# Patient Record
Sex: Female | Born: 1965 | Race: White | Hispanic: No | Smoking: Never smoker
Health system: Southern US, Community
[De-identification: ages and names within clinical notes are randomized; demographics above are authoritative.]

## PROBLEM LIST (undated history)

## (undated) DIAGNOSIS — F419 Anxiety disorder, unspecified: Secondary | ICD-10-CM

## (undated) DIAGNOSIS — G5601 Carpal tunnel syndrome, right upper limb: Secondary | ICD-10-CM

## (undated) DIAGNOSIS — I10 Essential (primary) hypertension: Secondary | ICD-10-CM

## (undated) DIAGNOSIS — E7800 Pure hypercholesterolemia, unspecified: Secondary | ICD-10-CM

## (undated) HISTORY — PX: OTHER SURGICAL HISTORY: SHX169

## (undated) HISTORY — PX: TONSILLECTOMY: SUR1361

## (undated) HISTORY — DX: Pure hypercholesterolemia, unspecified: E78.00

## (undated) HISTORY — DX: Essential (primary) hypertension: I10

## (undated) HISTORY — DX: Anxiety disorder, unspecified: F41.9

---

## 2001-08-26 ENCOUNTER — Emergency Department (HOSPITAL_COMMUNITY): Admission: EM | Admit: 2001-08-26 | Discharge: 2001-08-26 | Payer: Self-pay

## 2007-05-16 ENCOUNTER — Ambulatory Visit: Payer: Self-pay | Admitting: Obstetrics and Gynecology

## 2008-06-18 ENCOUNTER — Ambulatory Visit: Payer: Self-pay | Admitting: Obstetrics and Gynecology

## 2009-06-23 ENCOUNTER — Ambulatory Visit: Payer: Self-pay

## 2009-07-13 ENCOUNTER — Ambulatory Visit: Payer: Self-pay

## 2010-06-28 ENCOUNTER — Ambulatory Visit: Payer: Self-pay

## 2012-07-09 ENCOUNTER — Ambulatory Visit: Payer: Self-pay

## 2013-07-15 ENCOUNTER — Ambulatory Visit: Payer: Self-pay | Admitting: Obstetrics and Gynecology

## 2013-07-24 ENCOUNTER — Ambulatory Visit: Payer: Self-pay | Admitting: Obstetrics and Gynecology

## 2014-06-18 LAB — HM PAP SMEAR: HM PAP: NEGATIVE

## 2014-07-16 ENCOUNTER — Ambulatory Visit: Payer: Self-pay | Admitting: Obstetrics and Gynecology

## 2015-06-21 ENCOUNTER — Encounter: Payer: Self-pay | Admitting: *Deleted

## 2015-06-22 ENCOUNTER — Ambulatory Visit (INDEPENDENT_AMBULATORY_CARE_PROVIDER_SITE_OTHER): Payer: PRIVATE HEALTH INSURANCE | Admitting: Obstetrics and Gynecology

## 2015-06-22 ENCOUNTER — Encounter: Payer: Self-pay | Admitting: Obstetrics and Gynecology

## 2015-06-22 VITALS — BP 146/95 | HR 88 | Ht 62.0 in | Wt 135.1 lb

## 2015-06-22 DIAGNOSIS — I10 Essential (primary) hypertension: Secondary | ICD-10-CM

## 2015-06-22 DIAGNOSIS — Z01419 Encounter for gynecological examination (general) (routine) without abnormal findings: Secondary | ICD-10-CM | POA: Diagnosis not present

## 2015-06-22 DIAGNOSIS — Z3041 Encounter for surveillance of contraceptive pills: Secondary | ICD-10-CM

## 2015-06-22 DIAGNOSIS — F419 Anxiety disorder, unspecified: Secondary | ICD-10-CM

## 2015-06-22 MED ORDER — NORETHINDRONE ACET-ETHINYL EST 1-20 MG-MCG PO TABS: 1.0000 | ORAL_TABLET | Freq: Every day | ORAL | Status: DC

## 2015-06-22 MED ORDER — ALPRAZOLAM 0.5 MG PO TABS: 0.5000 mg | ORAL_TABLET | Freq: Every evening | ORAL | Status: DC | PRN

## 2015-06-22 MED ORDER — BUPROPION HCL ER (XL) 300 MG PO TB24: 300.0000 mg | ORAL_TABLET | Freq: Every day | ORAL | Status: DC

## 2015-06-22 MED ORDER — TRIAMTERENE-HCTZ 50-25 MG PO CAPS: 1.0000 | ORAL_CAPSULE | ORAL | Status: DC

## 2015-06-22 NOTE — Progress Notes (Signed)
  Subjective:     Meghan Sandoval is a 49 y.o. female and is here for a comprehensive physical exam. The patient reports no problems.  History   Social History  . Marital Status: Divorced    Spouse Name: N/A  . Number of Children: N/A  . Years of Education: N/A   Occupational History  . Not on file.   Social History Main Topics  . Smoking status: Never Smoker   . Smokeless tobacco: Never Used  . Alcohol Use: Yes  . Drug Use: No  . Sexual Activity: Yes   Other Topics Concern  . Not on file   Social History Narrative   Health Maintenance  Topic Date Due  . HIV Screening  09/04/1981  . TETANUS/TDAP  09/04/1985  . INFLUENZA VACCINE  06/21/2015  . PAP SMEAR  06/18/2017    The following portions of the patient's history were reviewed and updated as appropriate: allergies, current medications, past family history, past medical history, past social history, past surgical history and problem list.  Review of Systems A comprehensive review of systems was negative.   Objective:    General appearance: alert, cooperative and appears stated age Neck: no adenopathy, no carotid bruit, no JVD, supple, symmetrical, trachea midline and thyroid not enlarged, symmetric, no tenderness/mass/nodules Lungs: clear to auscultation bilaterally Breasts: normal appearance, no masses or tenderness Heart: regular rate and rhythm, S1, S2 normal, no murmur, click, rub or gallop Abdomen: soft, non-tender; bowel sounds normal; no masses,  no organomegaly Pelvic: cervix normal in appearance, no adnexal masses or tenderness, no cervical motion tenderness, rectovaginal septum normal, uterus normal size, shape, and consistency, vagina normal without discharge and vulva red without lesions, c/w contact irritation    Assessment:    Healthy female exam. HTN; vulvar irritation; OCP user with no menses; Anxiety under good control with current medications      Plan:  Pap not indicated; meds refilled with  changing from methhyldopa to Dyazide 50/25 qd.   Routine MMG ordered. To repeat labs at work in December. RTC 1 year or prn   See After Visit Summary for Counseling Recommendations

## 2015-06-22 NOTE — Patient Instructions (Signed)
  Place annual gynecologic exam patient instructions here.  Thank you for enrolling in MyChart. Please follow the instructions below to securely access your online medical record. MyChart allows you to send messages to your doctor, view your test results, manage appointments, and more.   How Do I Sign Up? 1. In your Internet browser, go to [employer] and enter [email or web address]. 2. Click on the Sign Up Now link in the Sign In box. You will see the New Member Sign Up page. 3. Enter your MyChart Access Code exactly as it appears below. You will not need to use this code after you've completed the sign-up process. If you do not sign up before the expiration date, you must request a new code.  MyChart Access Code: [email or web address] Expires: 08/21/2015  2:55 PM  4. Enter your Social Security Number ([id number]) and Date of Birth (mm/dd/yyyy) as indicated and click Submit. You will be taken to the next sign-up page. 5. Create a MyChart ID. This will be your MyChart login ID and cannot be changed, so think of one that is secure and easy to remember. 6. Create a MyChart password. You can change your password at any time. 7. Enter your Password Reset Question and Answer. This can be used at a later time if you forget your password.  8. Enter your e-mail address. You will receive e-mail notification when new information is available in MyChart. 9. Click Sign Up. You can now view your medical record.   Additional Information Remember, MyChart is NOT to be used for urgent needs. For medical emergencies, dial 911.

## 2015-06-24 ENCOUNTER — Encounter: Payer: Self-pay | Admitting: Family Medicine

## 2015-08-25 ENCOUNTER — Other Ambulatory Visit: Payer: Self-pay | Admitting: Obstetrics and Gynecology

## 2015-08-25 DIAGNOSIS — N6320 Unspecified lump in the left breast, unspecified quadrant: Secondary | ICD-10-CM

## 2015-09-14 ENCOUNTER — Ambulatory Visit: Payer: Self-pay

## 2015-09-14 ENCOUNTER — Other Ambulatory Visit: Payer: Self-pay

## 2015-09-22 ENCOUNTER — Ambulatory Visit
Admission: RE | Admit: 2015-09-22 | Discharge: 2015-09-22 | Disposition: A | Discharge status: Home / Self Care | Payer: PRIVATE HEALTH INSURANCE | Source: Ambulatory Visit | Attending: Obstetrics and Gynecology | Admitting: Obstetrics and Gynecology

## 2015-09-22 ENCOUNTER — Other Ambulatory Visit: Payer: Self-pay | Admitting: Obstetrics and Gynecology

## 2015-09-22 DIAGNOSIS — N6320 Unspecified lump in the left breast, unspecified quadrant: Secondary | ICD-10-CM

## 2015-09-22 DIAGNOSIS — R928 Other abnormal and inconclusive findings on diagnostic imaging of breast: Secondary | ICD-10-CM | POA: Insufficient documentation

## 2015-09-22 DIAGNOSIS — N63 Unspecified lump in breast: Secondary | ICD-10-CM | POA: Diagnosis present

## 2015-09-22 LAB — HM MAMMOGRAPHY

## 2015-09-28 ENCOUNTER — Telehealth: Payer: Self-pay | Admitting: *Deleted

## 2015-09-28 NOTE — Telephone Encounter (Signed)
-----   Message from Ulyses AmorMelody N Burr, [place] sent at 09/28/2015  3:29 PM EST ----- Please let her know I have reviewed f/u breast MMG and it is no change

## 2015-09-28 NOTE — Telephone Encounter (Signed)
Notified pt of results 

## 2016-01-03 ENCOUNTER — Encounter: Payer: Self-pay | Admitting: Obstetrics and Gynecology

## 2016-01-04 ENCOUNTER — Other Ambulatory Visit: Payer: Self-pay | Admitting: Obstetrics and Gynecology

## 2016-01-04 DIAGNOSIS — Z0000 Encounter for general adult medical examination without abnormal findings: Secondary | ICD-10-CM

## 2016-01-21 ENCOUNTER — Other Ambulatory Visit: Payer: Self-pay

## 2016-01-21 DIAGNOSIS — Z0000 Encounter for general adult medical examination without abnormal findings: Secondary | ICD-10-CM

## 2016-01-21 NOTE — Progress Notes (Signed)
Patient came in to have blood drawn per Harlow MaresMelody Shambley, CNM order.  Blood was drawn from the left arm without any incident.

## 2016-01-23 LAB — TESTOSTERONE,FREE AND TOTAL
TESTOSTERONE FREE: 5.6 pg/mL — AB (ref 0.0–4.2)
Testosterone: 28 ng/dL (ref 8–48)

## 2016-01-23 LAB — CMP12+LP+TP+TSH+6AC+CBC/D/PLT
ALT: 21 IU/L (ref 0–32)
AST: 20 IU/L (ref 0–40)
Albumin/Globulin Ratio: 1.2 (ref 1.1–2.5)
Albumin: 4.4 g/dL (ref 3.5–5.5)
Alkaline Phosphatase: 67 IU/L (ref 39–117)
BASOS ABS: 0 10*3/uL (ref 0.0–0.2)
BASOS: 1 %
BUN / CREAT RATIO: 14 (ref 9–23)
BUN: 16 mg/dL (ref 6–24)
Bilirubin Total: 0.4 mg/dL (ref 0.0–1.2)
CALCIUM: 9.8 mg/dL (ref 8.7–10.2)
CHOL/HDL RATIO: 4.5 ratio — AB (ref 0.0–4.4)
CREATININE: 1.15 mg/dL — AB (ref 0.57–1.00)
Chloride: 97 mmol/L (ref 96–106)
Cholesterol, Total: 264 mg/dL — ABNORMAL HIGH (ref 100–199)
EOS (ABSOLUTE): 0.1 10*3/uL (ref 0.0–0.4)
EOS: 2 %
Estimated CHD Risk: 1.1 times avg. — ABNORMAL HIGH (ref 0.0–1.0)
Free Thyroxine Index: 2.7 (ref 1.2–4.9)
GFR calc non Af Amer: 56 mL/min/{1.73_m2} — ABNORMAL LOW (ref >59)
GFR, EST AFRICAN AMERICAN: 65 mL/min/{1.73_m2} (ref >59)
GGT: 43 IU/L (ref 0–60)
GLOBULIN, TOTAL: 3.6 g/dL (ref 1.5–4.5)
GLUCOSE: 87 mg/dL (ref 65–99)
HDL: 59 mg/dL (ref >39)
Hematocrit: 41 % (ref 34.0–46.6)
Hemoglobin: 14.3 g/dL (ref 11.1–15.9)
IMMATURE GRANS (ABS): 0 10*3/uL (ref 0.0–0.1)
IMMATURE GRANULOCYTES: 0 %
IRON: 132 ug/dL (ref 27–159)
LDH: 156 IU/L (ref 119–226)
LDL Calculated: 173 mg/dL — ABNORMAL HIGH (ref 0–99)
LYMPHS ABS: 0.8 10*3/uL (ref 0.7–3.1)
LYMPHS: 17 %
MCH: 31.9 pg (ref 26.6–33.0)
MCHC: 34.9 g/dL (ref 31.5–35.7)
MCV: 92 fL (ref 79–97)
MONOCYTES: 7 %
Monocytes Absolute: 0.3 10*3/uL (ref 0.1–0.9)
NEUTROS PCT: 73 %
Neutrophils Absolute: 3.4 10*3/uL (ref 1.4–7.0)
PHOSPHORUS: 2.9 mg/dL (ref 2.5–4.5)
PLATELETS: 253 10*3/uL (ref 150–379)
Potassium: 4.5 mmol/L (ref 3.5–5.2)
RBC: 4.48 x10E6/uL (ref 3.77–5.28)
RDW: 12.6 % (ref 12.3–15.4)
Sodium: 139 mmol/L (ref 134–144)
T3 UPTAKE RATIO: 21 % — AB (ref 24–39)
T4, Total: 12.7 ug/dL — ABNORMAL HIGH (ref 4.5–12.0)
TOTAL PROTEIN: 8 g/dL (ref 6.0–8.5)
TSH: 2.84 u[IU]/mL (ref 0.450–4.500)
Triglycerides: 159 mg/dL — ABNORMAL HIGH (ref 0–149)
URIC ACID: 6.5 mg/dL (ref 2.5–7.1)
VLDL Cholesterol Cal: 32 mg/dL (ref 5–40)
WBC: 4.6 10*3/uL (ref 3.4–10.8)

## 2016-01-23 LAB — ESTRADIOL: Estradiol: <5 pg/mL

## 2016-01-23 LAB — VITAMIN D 25 HYDROXY (VIT D DEFICIENCY, FRACTURES): VIT D 25 HYDROXY: 54.5 ng/mL (ref 30.0–100.0)

## 2016-01-23 LAB — FSH/LH
FSH: 8.7 m[IU]/mL
LH: 4.2 m[IU]/mL

## 2016-01-23 LAB — PROGESTERONE: PROGESTERONE: 0.2 ng/mL

## 2016-01-25 ENCOUNTER — Other Ambulatory Visit: Payer: Self-pay | Admitting: Obstetrics and Gynecology

## 2016-01-25 ENCOUNTER — Telehealth: Payer: Self-pay | Admitting: Obstetrics and Gynecology

## 2016-01-25 NOTE — Telephone Encounter (Signed)
PT CALLED AND SHE RECEIVED HER HORMONE RESULTS IN THE MAIL FROM YOU AND SHE IS HAVING A HARD TIME UNDERSTAND THE RESULTS AND WAS HOPING YOU COULD CALL HER AND GO OVER THEM WITH HER.

## 2016-01-31 ENCOUNTER — Ambulatory Visit: Payer: Self-pay | Admitting: Physician Assistant

## 2016-01-31 ENCOUNTER — Encounter: Payer: Self-pay | Admitting: Physician Assistant

## 2016-01-31 VITALS — BP 139/97 | HR 85 | Temp 98.6°F

## 2016-01-31 DIAGNOSIS — J069 Acute upper respiratory infection, unspecified: Secondary | ICD-10-CM

## 2016-01-31 MED ORDER — AZITHROMYCIN 250 MG PO TABS: ORAL_TABLET | ORAL | Status: AC

## 2016-01-31 NOTE — Progress Notes (Signed)
S: C/o runny nose and congestion for 7 days, no fever, chills, cp/sob, v/d; mucus was green this am but clear throughout the day, cough is sporadic, congested;   Using otc meds: allegra d  O: PE: vitals wnl, nad, perrl eomi, normocephalic, tms dull, nasal mucosa red and swollen, throat injected, neck supple no lymph, lungs c t a, cv rrr, neuro intact  A:  Acute uri   P: zpack, otc delsym for cough, drink fluids, continue regular meds , use otc meds of choice, return if not improving in 5 days, return earlier if worsening

## 2016-02-24 ENCOUNTER — Encounter: Payer: Self-pay | Admitting: Obstetrics and Gynecology

## 2016-03-15 ENCOUNTER — Ambulatory Visit (INDEPENDENT_AMBULATORY_CARE_PROVIDER_SITE_OTHER): Payer: Managed Care, Other (non HMO) | Admitting: Nurse Practitioner

## 2016-03-15 ENCOUNTER — Encounter: Payer: Self-pay | Admitting: Nurse Practitioner

## 2016-03-15 VITALS — BP 136/84 | HR 96 | Temp 98.9°F | Ht 62.0 in | Wt 134.4 lb

## 2016-03-15 DIAGNOSIS — F411 Generalized anxiety disorder: Secondary | ICD-10-CM | POA: Diagnosis not present

## 2016-03-15 DIAGNOSIS — E785 Hyperlipidemia, unspecified: Secondary | ICD-10-CM

## 2016-03-15 DIAGNOSIS — R51 Headache: Secondary | ICD-10-CM

## 2016-03-15 DIAGNOSIS — Z7189 Other specified counseling: Secondary | ICD-10-CM | POA: Diagnosis not present

## 2016-03-15 DIAGNOSIS — Z7689 Persons encountering health services in other specified circumstances: Secondary | ICD-10-CM

## 2016-03-15 DIAGNOSIS — R519 Headache, unspecified: Secondary | ICD-10-CM

## 2016-03-15 DIAGNOSIS — I10 Essential (primary) hypertension: Secondary | ICD-10-CM | POA: Diagnosis not present

## 2016-03-15 MED ORDER — BUSPIRONE HCL 7.5 MG PO TABS: 7.5000 mg | ORAL_TABLET | Freq: Three times a day (TID) | ORAL | Status: DC

## 2016-03-15 MED ORDER — LOSARTAN POTASSIUM-HCTZ 50-12.5 MG PO TABS: 1.0000 | ORAL_TABLET | Freq: Every day | ORAL | Status: DC

## 2016-03-15 NOTE — Patient Instructions (Signed)
[facility] Nice to meet you.   Please stop the Dyazide and start the new medication tomorrow morning. Have the nurse at your work check this in 1 week or sooner if you feel strange (shouldn't but just as a precaution).   Try the Buspirone 1 at night for 7 nights then can add as needed throughout the day- this will take the place of xanax- only use if severe anxiety.   Follow up in 4 weeks with Dr. Dan HumphreysWalker.

## 2016-03-15 NOTE — Progress Notes (Signed)
Patient ID: Meghan Sandoval, female    DOB: 07-02-1966  Age: 50 y.o. MRN: [id number]  CC: New Patient (Initial Visit)   HPI Meghan Sandoval presents for establishing care and CC of anxiety and HTN.   1) New Pt Info:   Immunizations- unknown- need records   Mammogram- 08/2015  Pap- has GYN (Melody LeonoreShambley)  2) Chronic Problems-  Frequent HA, HTN, HLD, Anxiety   3) Acute Problems-  Wellbutrin 300 mg daily x 5 years- doesn't feel like it is working as well currently   Xanax 0.5 mg tablet takes 1/2 tablet when stressed   Lisinopril- makes her cough   Willing to try another HTN medication    History Meghan Sandoval has a past medical history of Anxiety; Hypertension; and Hypercholesteremia.   She has past surgical history that includes no past surgery.   Her family history includes Other in her mother. There is no history of Breast cancer.She reports that she has never smoked. She has never used smokeless tobacco. She reports that she drinks alcohol. She reports that she does not use illicit drugs.  Outpatient Prescriptions Prior to Visit  Medication Sig Dispense Refill  . ALPRAZolam (XANAX) 0.5 MG tablet Take 1 tablet (0.5 mg total) by mouth at bedtime as needed for anxiety. 60 tablet 1  . buPROPion (WELLBUTRIN XL) 300 MG 24 hr tablet Take 1 tablet (300 mg total) by mouth daily. 30 tablet 12  . norethindrone-ethinyl estradiol (MICROGESTIN,JUNEL,LOESTRIN) 1-20 MG-MCG tablet Take 1 tablet by mouth daily. 1 Package 12  . triamterene-hydrochlorothiazide (DYAZIDE) 50-25 MG per capsule Take 1 capsule by mouth every morning. 30 capsule 6   No facility-administered medications prior to visit.    ROS Review of Systems  Constitutional: Negative for fever, chills, diaphoresis and fatigue.  Respiratory: Positive for cough. Negative for chest tightness, shortness of breath and wheezing.   Cardiovascular: Negative for chest pain, palpitations and leg swelling.  Gastrointestinal: Negative for nausea,  vomiting and diarrhea.  Skin: Negative for rash.  Neurological: Negative for dizziness, weakness, numbness and headaches.  Psychiatric/Behavioral: Negative for suicidal ideas and sleep disturbance. The patient is nervous/anxious.     Objective:  BP 136/84 mmHg  Pulse 96  Temp(Src) 98.9 F (37.2 C) (Oral)  Ht 5\' 2"  (1.575 m)  Wt 134 lb 6.4 oz (60.963 kg)  BMI 24.58 kg/m2  SpO2 98%  Physical Exam  Constitutional: She is oriented to person, place, and time. She appears well-developed and well-nourished. No distress.  HENT:  Head: Normocephalic and atraumatic.  Right Ear: External ear normal.  Left Ear: External ear normal.  Cardiovascular: Normal rate, regular rhythm and normal heart sounds.   Pulmonary/Chest: Effort normal and breath sounds normal. No respiratory distress. She has no wheezes. She has no rales. She exhibits no tenderness.  Neurological: She is alert and oriented to person, place, and time. No cranial nerve deficit. She exhibits normal muscle tone. Coordination normal.  Skin: Skin is warm and dry. No rash noted. She is not diaphoretic.  Psychiatric: She has a normal mood and affect. Her behavior is normal. Judgment and thought content normal.   Assessment & Plan:   Meghan Sandoval was seen today for new patient (initial visit).  Diagnoses and all orders for this visit:  Encounter to establish care  GAD (generalized anxiety disorder)  Benign essential HTN  Other orders -     Discontinue: busPIRone (BUSPAR) 7.5 MG tablet; Take 1 tablet (7.5 mg total) by mouth 3 (three) times daily. -  Discontinue: losartan-hydrochlorothiazide (HYZAAR) 50-12.5 MG tablet; Take 1 tablet by mouth daily. -     Discontinue: busPIRone (BUSPAR) 7.5 MG tablet; Take 1 tablet (7.5 mg total) by mouth 3 (three) times daily. -     busPIRone (BUSPAR) 7.5 MG tablet; Take 1 tablet (7.5 mg total) by mouth 3 (three) times daily. -     losartan-hydrochlorothiazide (HYZAAR) 50-12.5 MG tablet; Take 1  tablet by mouth daily.   I have discontinued Ms. Mcnelly's triamterene-hydrochlorothiazide. I am also having her maintain her ALPRAZolam, buPROPion, norethindrone-ethinyl estradiol, busPIRone, and losartan-hydrochlorothiazide.  Meds ordered this encounter  Medications  . DISCONTD: busPIRone (BUSPAR) 7.5 MG tablet    Sig: Take 1 tablet (7.5 mg total) by mouth 3 (three) times daily.    Dispense:  90 tablet    Refill:  0    Order Specific Question:  Supervising Provider    Answer:  Duncan Dull L [[id number]]  . DISCONTD: losartan-hydrochlorothiazide (HYZAAR) 50-12.5 MG tablet    Sig: Take 1 tablet by mouth daily.    Dispense:  30 tablet    Refill:  1    Order Specific Question:  Supervising Provider    Answer:  Duncan Dull L [[id number]]  . DISCONTD: busPIRone (BUSPAR) 7.5 MG tablet    Sig: Take 1 tablet (7.5 mg total) by mouth 3 (three) times daily.    Dispense:  90 tablet    Refill:  0    Order Specific Question:  Supervising Provider    Answer:  Duncan Dull L [[id number]]  . busPIRone (BUSPAR) 7.5 MG tablet    Sig: Take 1 tablet (7.5 mg total) by mouth 3 (three) times daily.    Dispense:  90 tablet    Refill:  0    Order Specific Question:  Supervising Provider    Answer:  Duncan Dull L [[id number]]  . losartan-hydrochlorothiazide (HYZAAR) 50-12.5 MG tablet    Sig: Take 1 tablet by mouth daily.    Dispense:  30 tablet    Refill:  1    Order Specific Question:  Supervising Provider    Answer:  Sherlene Shams [[id number]]     Follow-up: Return in about 4 weeks (around 04/12/2016) for Follow up for HTN medication change .

## 2016-03-19 ENCOUNTER — Encounter: Payer: Self-pay | Admitting: Nurse Practitioner

## 2016-03-19 DIAGNOSIS — R51 Headache: Secondary | ICD-10-CM

## 2016-03-19 DIAGNOSIS — R519 Headache, unspecified: Secondary | ICD-10-CM | POA: Insufficient documentation

## 2016-03-19 DIAGNOSIS — E785 Hyperlipidemia, unspecified: Secondary | ICD-10-CM | POA: Insufficient documentation

## 2016-03-19 DIAGNOSIS — F411 Generalized anxiety disorder: Secondary | ICD-10-CM | POA: Insufficient documentation

## 2016-03-19 DIAGNOSIS — I10 Essential (primary) hypertension: Secondary | ICD-10-CM | POA: Insufficient documentation

## 2016-03-19 NOTE — Assessment & Plan Note (Signed)
Pt stable currently on Wellbutrin 300 mg x 5 years, but feels it isn't working currently. Discussed different treatment options, will supplement with Buspirone and save xanax for severe anxiety.  Follow up in 4 weeks for medications

## 2016-03-19 NOTE — Assessment & Plan Note (Signed)
Discussed acute and chronic issues. Reviewed health maintenance measures, PFSHx, and immunizations. Obtain records from previous facility.   

## 2016-03-19 NOTE — Assessment & Plan Note (Signed)
Stable currently- will obtain records with last labs

## 2016-03-19 NOTE — Assessment & Plan Note (Signed)
BP Readings from Last 3 Encounters:  03/15/16 136/84  01/31/16 139/97  06/22/15 146/95   Instructions to pt on AVS were reviewed  Stop Dyazide and start the Hyzaar tomorrow morning. Have the nurse at your work check this in 1 week or sooner if feeling strange  Fu in 4 weeks with new PCP

## 2016-03-19 NOTE — Assessment & Plan Note (Signed)
Stable currently Will follow as needed

## 2016-03-28 ENCOUNTER — Other Ambulatory Visit: Payer: Self-pay | Admitting: *Deleted

## 2016-03-28 ENCOUNTER — Encounter: Payer: Self-pay | Admitting: Obstetrics and Gynecology

## 2016-03-28 MED ORDER — NORETHINDRONE ACET-ETHINYL EST 1-20 MG-MCG PO TABS: 1.0000 | ORAL_TABLET | Freq: Every day | ORAL | Status: DC

## 2016-04-06 ENCOUNTER — Ambulatory Visit: Payer: Self-pay | Admitting: Physician Assistant

## 2016-04-06 ENCOUNTER — Encounter: Payer: Self-pay | Admitting: Physician Assistant

## 2016-04-06 VITALS — BP 130/80 | HR 89 | Temp 98.6°F

## 2016-04-06 DIAGNOSIS — G5601 Carpal tunnel syndrome, right upper limb: Secondary | ICD-10-CM

## 2016-04-06 MED ORDER — MELOXICAM 15 MG PO TABS: 15.0000 mg | ORAL_TABLET | Freq: Every day | ORAL | Status: DC

## 2016-04-06 NOTE — Progress Notes (Signed)
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 CTS R   P Wrist immobilizer . rx Mobic 15 mg one qd. topical heat or ice. F/u prn not improving. wrist stretches referred to [facility]

## 2016-04-19 ENCOUNTER — Encounter: Payer: Self-pay | Admitting: Internal Medicine

## 2016-04-19 ENCOUNTER — Ambulatory Visit (INDEPENDENT_AMBULATORY_CARE_PROVIDER_SITE_OTHER): Payer: Managed Care, Other (non HMO) | Admitting: Internal Medicine

## 2016-04-19 VITALS — BP 156/88 | HR 86 | Ht 62.0 in | Wt 137.4 lb

## 2016-04-19 DIAGNOSIS — I10 Essential (primary) hypertension: Secondary | ICD-10-CM | POA: Diagnosis not present

## 2016-04-19 DIAGNOSIS — F411 Generalized anxiety disorder: Secondary | ICD-10-CM

## 2016-04-19 LAB — BASIC METABOLIC PANEL
BUN: 16 mg/dL (ref 6–23)
CALCIUM: 9.5 mg/dL (ref 8.4–10.5)
CO2: 29 mEq/L (ref 19–32)
Chloride: 98 mEq/L (ref 96–112)
Creatinine, Ser: 0.89 mg/dL (ref 0.40–1.20)
GFR: 71.47 mL/min (ref >60.00)
GLUCOSE: 90 mg/dL (ref 70–99)
Potassium: 4.2 mEq/L (ref 3.5–5.1)
SODIUM: 134 meq/L — AB (ref 135–145)

## 2016-04-19 MED ORDER — SERTRALINE HCL 25 MG PO TABS: 25.0000 mg | ORAL_TABLET | Freq: Every day | ORAL | Status: DC

## 2016-04-19 MED ORDER — LOSARTAN POTASSIUM-HCTZ 50-12.5 MG PO TABS: 1.0000 | ORAL_TABLET | Freq: Every day | ORAL | Status: DC

## 2016-04-19 NOTE — Patient Instructions (Addendum)
Stop Buspar.  Start Sertraline 25mg  daily at bedtime. Continue Wellbutrin.  Email with update next week.  Follow up in 4 weeks.

## 2016-04-19 NOTE — Assessment & Plan Note (Signed)
BP Readings from Last 3 Encounters:  04/19/16 156/88  04/06/16 130/80  03/15/16 136/84   BP elevated, however she reports it is better controlled at home and work. Anxiety likely contributing.  Will continue Losartan-HCTZ. Cr and K with labs today. Follow up in 4 weeks.

## 2016-04-19 NOTE — Progress Notes (Signed)
Pre visit review using our clinic review tool, if applicable. No additional management support is needed unless otherwise documented below in the visit note. 

## 2016-04-19 NOTE — Progress Notes (Signed)
Subjective:    Patient ID: Meghan Sandoval, female    DOB: [birth year], 50 y.o.   MRN: [id number]  HPI  49YO female presents to establish care. Formerly seen by Naomie Dean, NP.  HTN - BP always high at doctor visit. Compliant with medication. Typically 140/80. Started on BP meds a few years. No history of pre-eclampsia.  Anxiety - No improvement with use of Buspar. Taking Wellbutrin for about 5 years. This seems to work best for her. Tried Prozac, Lexapro in past but could not tolerate. Takes Alprazolam rarely for anxiety. Notes significant stressors with her job. Works as a IT consultant.  GERD - Has occasional symptoms of reflux symptoms using Ranitidine with minimal improvement.  Symptoms have currently resolved.  Wt Readings from Last 3 Encounters:  04/19/16 137 lb 6.4 oz (62.324 kg)  03/15/16 134 lb 6.4 oz (60.963 kg)  06/22/15 135 lb 1.6 oz (61.281 kg)   BP Readings from Last 3 Encounters:  04/19/16 156/88  04/06/16 130/80  03/15/16 136/84    Past Medical History  Diagnosis Date  . Anxiety   . Hypertension   . Hypercholesteremia    Family History  Problem Relation Age of Onset  . Breast cancer Neg Hx   . Other Mother     gastrointestinal   Past Surgical History  Procedure Laterality Date  . No past surgery     Social History   Social History  . Marital Status: Divorced    Spouse Name: N/A  . Number of Children: 2  . Years of Education: Advice worker   Occupational History  . [employer]   Social History Main Topics  . Smoking status: Never Smoker   . Smokeless tobacco: Never Used  . Alcohol Use: 0.0 oz/week    0 Standard drinks or equivalent per week     Comment: occasionally  . Drug Use: No  . Sexual Activity:    Partners: Male    Birth Control/ Protection: Post-menopausal   Other Topics Concern  . None   Social History Narrative   Patient lives at home with fiance.    Caffeine Use: 2-3 cups daily   Employed as a Civil Service fast streamer    Some college    2 children (1 daughter works as a Clinical biochemist at [employer])        Review of Systems  Constitutional: Negative for fever, chills, appetite change, fatigue and unexpected weight change.  Eyes: Negative for visual disturbance.  Respiratory: Negative for shortness of breath.   Cardiovascular: Negative for chest pain and leg swelling.  Gastrointestinal: Negative for nausea, vomiting, abdominal pain, diarrhea, constipation and abdominal distention.  Skin: Negative for color change and rash.  Hematological: Negative for adenopathy. Does not bruise/bleed easily.  Psychiatric/Behavioral: Negative for sleep disturbance and dysphoric mood. The patient is nervous/anxious.        Objective:    BP 156/88 mmHg  Pulse 86  Ht  (1.575 m)  Wt 137 lb 6.4 oz (62.324 kg)  BMI 25.12 kg/m2  SpO2 98% Physical Exam  Constitutional: She is oriented to person, place, and time. She appears well-developed and well-nourished. No distress.  HENT:  Head: Normocephalic and atraumatic.  Right Ear: External ear normal.  Left Ear: External ear normal.  Nose: Nose normal.  Mouth/Throat: Oropharynx is clear and moist. No oropharyngeal exudate.  Eyes: Conjunctivae are normal. Pupils are equal, round, and reactive to light. Right eye exhibits no discharge. Left eye exhibits no discharge.  No scleral icterus.  Neck: Normal range of motion. Neck supple. No tracheal deviation present. No thyromegaly present.  Cardiovascular: Normal rate, regular rhythm, normal heart sounds and intact distal pulses.  Exam reveals no gallop and no friction rub.   No murmur heard. Pulmonary/Chest: Effort normal and breath sounds normal. No respiratory distress. She has no wheezes. She has no rales. She exhibits no tenderness.  Musculoskeletal: Normal range of motion. She exhibits no edema or tenderness.  Lymphadenopathy:    She has no cervical adenopathy.  Neurological: She is alert and oriented to person,  place, and time. No cranial nerve deficit. She exhibits normal muscle tone. Coordination normal.  Skin: Skin is warm and dry. No rash noted. She is not diaphoretic. No erythema. No pallor.  Psychiatric: Her speech is normal and behavior is normal. Judgment and thought content normal. Her mood appears anxious. Cognition and memory are normal.          Assessment & Plan:   Problem List Items Addressed This Visit      Unprioritized   Benign essential HTN    BP Readings from Last 3 Encounters:  04/19/16 156/88  04/06/16 130/80  03/15/16 136/84   BP elevated, however she reports it is better controlled at home and work. Anxiety likely contributing.  Will continue Losartan-HCTZ. Cr and K with labs today. Follow up in 4 weeks.      Relevant Medications   losartan-hydrochlorothiazide (HYZAAR) 50-12.5 MG tablet   Other Relevant Orders   Basic Metabolic Panel (BMET)   GAD (generalized anxiety disorder) - Primary    Symptoms poorly controlled. Will stop buspar. Add Sertraline 25mg  at bedtime. She will email with update next week, plan to titrate up dose of Sertraline to 50mg  if tolerated well. Continue Alprazolam prn for episodes of severe anxiety.          Return in about 4 weeks (around 05/17/2016) for Recheck.  Ronna PolioJennifer Armend Hochstatter, MD Internal Medicine [facility]

## 2016-04-19 NOTE — Assessment & Plan Note (Signed)
Symptoms poorly controlled. Will stop buspar. Add Sertraline 25mg  at bedtime. She will email with update next week, plan to titrate up dose of Sertraline to 50mg  if tolerated well. Continue Alprazolam prn for episodes of severe anxiety.

## 2016-05-01 ENCOUNTER — Other Ambulatory Visit: Payer: Self-pay | Admitting: Internal Medicine

## 2016-05-01 ENCOUNTER — Encounter: Payer: Self-pay | Admitting: Internal Medicine

## 2016-05-01 MED ORDER — SERTRALINE HCL 50 MG PO TABS: 50.0000 mg | ORAL_TABLET | Freq: Every day | ORAL | Status: DC

## 2016-05-02 ENCOUNTER — Other Ambulatory Visit: Payer: Self-pay | Admitting: Nurse Practitioner

## 2016-05-02 NOTE — Telephone Encounter (Signed)
Refilled in 06/2015 and last seen 04/19/16. Please advise?

## 2016-06-09 ENCOUNTER — Encounter: Payer: Self-pay | Admitting: Internal Medicine

## 2016-06-09 ENCOUNTER — Ambulatory Visit (INDEPENDENT_AMBULATORY_CARE_PROVIDER_SITE_OTHER): Payer: Managed Care, Other (non HMO) | Admitting: Internal Medicine

## 2016-06-09 VITALS — BP 158/90 | HR 85 | Ht 62.0 in | Wt 137.8 lb

## 2016-06-09 DIAGNOSIS — F411 Generalized anxiety disorder: Secondary | ICD-10-CM

## 2016-06-09 DIAGNOSIS — I10 Essential (primary) hypertension: Secondary | ICD-10-CM | POA: Diagnosis not present

## 2016-06-09 NOTE — Assessment & Plan Note (Signed)
BP Readings from Last 3 Encounters:  06/09/16 158/90  04/19/16 156/88  04/06/16 130/80   BP continues to be elevated. She reports BP better controlled at home. Discussed option of increasing Losartan-HCTZ to 100-12.5mg , but she prefers to hold for now, given she is traveling next week. She will follow up with new PCP in 4 weeks here.

## 2016-06-09 NOTE — Patient Instructions (Addendum)
Continue current medication     Follow up in 4 weeks

## 2016-06-09 NOTE — Progress Notes (Signed)
Subjective:    Patient ID: Meghan Sandoval, female    DOB: [birth year], 50 y.o.   MRN: [id number]  HPI  50YO female presents for follow up. Recently seen by Naomie Deanarrie Doss, NP, then by me in 03/2016.  Evaluated for HTN and GAD. Started on Sertraline.  Anxiety much better on Sertraline. She reports this medicine works better in terms of controlling anxiety, than any medication she has tried in the past.  HTN - Compliant with medication. No HA, CP. Does not check BP at home.   Wt Readings from Last 3 Encounters:  06/09/16 137 lb 12.8 oz (62.506 kg)  04/19/16 137 lb 6.4 oz (62.324 kg)  03/15/16 134 lb 6.4 oz (60.963 kg)   BP Readings from Last 3 Encounters:  06/09/16 158/90  04/19/16 156/88  04/06/16 130/80    Past Medical History  Diagnosis Date  . Anxiety   . Hypertension   . Hypercholesteremia    Family History  Problem Relation Age of Onset  . Breast cancer Neg Hx   . Other Mother     gastrointestinal   Past Surgical History  Procedure Laterality Date  . No past surgery     Social History   Social History  . Marital Status: Divorced    Spouse Name: N/A  . Number of Children: 2  . Years of Education: [employer]   Occupational History  . [employer] [place]   Social History Main Topics  . Smoking status: Never Smoker   . Smokeless tobacco: Never Used  . Alcohol Use: 0.0 oz/week    0 Standard drinks or equivalent per week     Comment: occasionally  . Drug Use: No  . Sexual Activity:    Partners: Male    Birth Control/ Protection: Post-menopausal   Other Topics Concern  . None   Social History Narrative   Patient lives at home with fiance.    Caffeine Use: 2-3 cups daily   Employed as a Psychologist, sport and exerciseparalegal/ legal assistant    Some college    2 children (1 daughter works as a Clinical biochemistCMA at [employer])        Review of Systems  Constitutional: Negative for fever, chills, appetite change, fatigue and unexpected weight change.  Eyes: Negative for visual  disturbance.  Respiratory: Negative for shortness of breath.   Cardiovascular: Negative for chest pain, palpitations and leg swelling.  Gastrointestinal: Negative for nausea, vomiting, abdominal pain, diarrhea and constipation.  Musculoskeletal: Negative for myalgias and arthralgias.  Skin: Negative for color change and rash.  Neurological: Negative for headaches.  Hematological: Negative for adenopathy. Does not bruise/bleed easily.  Psychiatric/Behavioral: Negative for behavioral problems, sleep disturbance, dysphoric mood and agitation. The patient is not nervous/anxious.        Objective:    BP 158/90 mmHg  Pulse 85  Ht 5\' 2"  (1.575 m)  Wt 137 lb 12.8 oz (62.506 kg)  BMI 25.20 kg/m2  SpO2 99% Physical Exam  Constitutional: She is oriented to person, place, and time. She appears well-developed and well-nourished. No distress.  HENT:  Head: Normocephalic and atraumatic.  Right Ear: External ear normal.  Left Ear: External ear normal.  Nose: Nose normal.  Mouth/Throat: Oropharynx is clear and moist. No oropharyngeal exudate.  Eyes: Conjunctivae are normal. Pupils are equal, round, and reactive to light. Right eye exhibits no discharge. Left eye exhibits no discharge. No scleral icterus.  Neck: Normal range of motion. Neck supple. No tracheal deviation present. No thyromegaly present.  Cardiovascular: Normal rate, regular rhythm, normal heart sounds and intact distal pulses.  Exam reveals no gallop and no friction rub.   No murmur heard. Pulmonary/Chest: Effort normal and breath sounds normal. No respiratory distress. She has no wheezes. She has no rales. She exhibits no tenderness.  Musculoskeletal: Normal range of motion. She exhibits no edema or tenderness.  Lymphadenopathy:    She has no cervical adenopathy.  Neurological: She is alert and oriented to person, place, and time. No cranial nerve deficit. She exhibits normal muscle tone. Coordination normal.  Skin: Skin is warm  and dry. No rash noted. She is not diaphoretic. No erythema. No pallor.  Psychiatric: She has a normal mood and affect. Her behavior is normal. Judgment and thought content normal.          Assessment & Plan:   Problem List Items Addressed This Visit      Unprioritized   Benign essential HTN    BP Readings from Last 3 Encounters:  06/09/16 158/90  04/19/16 156/88  04/06/16 130/80   BP continues to be elevated. She reports BP better controlled at home. Discussed option of increasing Losartan-HCTZ to 100-12.5mg , but she prefers to hold for now, given she is traveling next week. She will follow up with new PCP in 4 weeks here.      GAD (generalized anxiety disorder) - Primary    Symptoms improved with addition of Sertraline. Discussed option of increasing dose in future, but will hold for now, given significant improvement. Continue Wellbutrin and prn Alprazolam.          Return in about 4 weeks (around 07/07/2016) for New Patient.  Ronna Polio, MD Internal Medicine [facility]

## 2016-06-09 NOTE — Assessment & Plan Note (Signed)
Symptoms improved with addition of Sertraline. Discussed option of increasing dose in future, but will hold for now, given significant improvement. Continue Wellbutrin and prn Alprazolam.

## 2016-06-09 NOTE — Progress Notes (Signed)
Pre visit review using our clinic review tool, if applicable. No additional management support is needed unless otherwise documented below in the visit note. 

## 2016-06-21 ENCOUNTER — Encounter: Payer: PRIVATE HEALTH INSURANCE | Admitting: Obstetrics and Gynecology

## 2016-06-22 ENCOUNTER — Encounter: Payer: PRIVATE HEALTH INSURANCE | Admitting: Obstetrics and Gynecology

## 2016-06-28 ENCOUNTER — Ambulatory Visit (INDEPENDENT_AMBULATORY_CARE_PROVIDER_SITE_OTHER): Payer: Managed Care, Other (non HMO) | Admitting: Obstetrics and Gynecology

## 2016-06-28 ENCOUNTER — Encounter: Payer: Self-pay | Admitting: Obstetrics and Gynecology

## 2016-06-28 VITALS — BP 160/102 | HR 98 | Ht 62.0 in | Wt 137.9 lb

## 2016-06-28 DIAGNOSIS — Z01419 Encounter for gynecological examination (general) (routine) without abnormal findings: Secondary | ICD-10-CM | POA: Diagnosis not present

## 2016-06-28 MED ORDER — NORETHINDRONE ACET-ETHINYL EST 1-20 MG-MCG PO TABS: 1.0000 | ORAL_TABLET | Freq: Every day | ORAL | 4 refills | Status: DC

## 2016-06-28 MED ORDER — ALPRAZOLAM 0.5 MG PO TABS: 0.5000 mg | ORAL_TABLET | Freq: Every evening | ORAL | 1 refills | Status: DC | PRN

## 2016-06-28 MED ORDER — BUPROPION HCL ER (XL) 300 MG PO TB24: 300.0000 mg | ORAL_TABLET | Freq: Every day | ORAL | 4 refills | Status: DC

## 2016-06-28 MED ORDER — SERTRALINE HCL 50 MG PO TABS: 50.0000 mg | ORAL_TABLET | Freq: Every day | ORAL | 4 refills | Status: DC

## 2016-06-28 MED ORDER — LOSARTAN POTASSIUM-HCTZ 50-12.5 MG PO TABS: 1.0000 | ORAL_TABLET | Freq: Every day | ORAL | 4 refills | Status: DC

## 2016-06-28 NOTE — Progress Notes (Signed)
Subjective:   Meghan Sandoval is a 50 y.o. G2P0 Caucasian female here for a routine well-woman exam.  No LMP recorded. Patient is postmenopausal.    Current complaints: NONE PCP: [facility]       doesn't desire labs  Social History: Sexual: heterosexual Marital Status: married Living situation: with family Occupation: unknown occupation Tobacco/alcohol: no tobacco use Illicit drugs: no history of illicit drug use  The following portions of the patient's history were reviewed and updated as appropriate: allergies, current medications, past family history, past medical history, past social history, past surgical history and problem list.  Past Medical History Past Medical History:  Diagnosis Date  . Anxiety   . Hypercholesteremia   . Hypertension     Past Surgical History Past Surgical History:  Procedure Laterality Date  . no past surgery      Gynecologic History G2P0  No LMP recorded. Patient is postmenopausal. Contraception: OCP (estrogen/progesterone) Last Pap: 2015. Results were: normal Last mammogram: 2016. Results were: normal  Obstetric History OB History  Gravida Para Term Preterm AB Living  2         2  SAB TAB Ectopic Multiple Live Births          2    # Outcome Date GA Lbr Len/2nd Weight Sex Delivery Anes PTL Lv  2 Gravida 1996    F Vag-Spont   LIV  1 Gravida 1992    F Vag-Spont   LIV      Current Medications Current Outpatient Prescriptions on File Prior to Visit  Medication Sig Dispense Refill  . ALPRAZolam (XANAX) 0.5 MG tablet Take 1 tablet (0.5 mg total) by mouth at bedtime as needed for anxiety. 60 tablet 1  . buPROPion (WELLBUTRIN XL) 300 MG 24 hr tablet Take 1 tablet (300 mg total) by mouth daily. 30 tablet 12  . losartan-hydrochlorothiazide (HYZAAR) 50-12.5 MG tablet Take 1 tablet by mouth daily. 90 tablet 1  . norethindrone-ethinyl estradiol (MICROGESTIN,JUNEL,LOESTRIN) 1-20 MG-MCG tablet Take 1 tablet by mouth daily. 1 Package 4  .  sertraline (ZOLOFT) 50 MG tablet Take 1 tablet (50 mg total) by mouth daily. 30 tablet 6   No current facility-administered medications on file prior to visit.     Review of Systems Patient denies any headaches, blurred vision, shortness of breath, chest pain, abdominal pain, problems with bowel movements, urination, or intercourse.  Objective:  BP (!) 160/102   Pulse 98   Ht 5\' 2"  (1.575 m)   Wt 137 lb 14.4 oz (62.6 kg)   BMI 25.22 kg/m  Physical Exam  General:  Well developed, well nourished, no acute distress. She is alert and oriented x3. Skin:  Warm and dry Neck:  Midline trachea, no thyromegaly or nodules Cardiovascular: Regular rate and rhythm, no murmur heard Lungs:  Effort normal, all lung fields clear to auscultation bilaterally Breasts:  No dominant palpable mass, retraction, or nipple discharge Abdomen:  Soft, non tender, no hepatosplenomegaly or masses Pelvic:  External genitalia is normal in appearance.  The vagina is normal in appearance. The cervix is bulbous, no CMT.  Thin prep pap is not done. Uterus is felt to be normal size, shape, and contour.  No adnexal masses or tenderness noted. Extremities:  No swelling or varicosities noted Psych:  She has a normal mood and affect  Assessment:   Healthy well-woman exam Essential HTN Anxiety   Plan:  meds refilled F/U 1 year for AE, or sooner if needed Mammogram ordered  Pailynn Vahey  Rockney Ghee, CNM

## 2016-06-28 NOTE — Patient Instructions (Signed)
Preventive Care for Adults, Female A healthy lifestyle and preventive care can promote health and wellness. Preventive health guidelines for women include the following key practices.  A routine yearly physical is a good way to check with your health care provider about your health and preventive screening. It is a chance to share any concerns and updates on your health and to receive a thorough exam.  Visit your dentist for a routine exam and preventive care every 6 months. Brush your teeth twice a day and floss once a day. Good oral hygiene prevents tooth decay and gum disease.  The frequency of eye exams is based on your age, health, family medical history, use of contact lenses, and other factors. Follow your health care provider's recommendations for frequency of eye exams.  Eat a healthy diet. Foods like vegetables, fruits, whole grains, low-fat dairy products, and lean protein foods contain the nutrients you need without too many calories. Decrease your intake of foods high in solid fats, added sugars, and salt. Eat the right amount of calories for you.Get information about a proper diet from your health care provider, if necessary.  Regular physical exercise is one of the most important things you can do for your health. Most adults should get at least 150 minutes of moderate-intensity exercise (any activity that increases your heart rate and causes you to sweat) each week. In addition, most adults need muscle-strengthening exercises on 2 or more days a week.  Maintain a healthy weight. The body mass index (BMI) is a screening tool to identify possible weight problems. It provides an estimate of body fat based on height and weight. Your health care provider can find your BMI and can help you achieve or maintain a healthy weight.For adults 20 years and older:  A BMI below 18.5 is considered underweight.  A BMI of 18.5 to 24.9 is normal.  A BMI of 25 to 29.9 is considered  overweight.  A BMI of 30 and above is considered obese.  Maintain normal blood lipids and cholesterol levels by exercising and minimizing your intake of saturated fat. Eat a balanced diet with plenty of fruit and vegetables. Blood tests for lipids and cholesterol should begin at age 64 and be repeated every 5 years. If your lipid or cholesterol levels are high, you are over 50, or you are at high risk for heart disease, you may need your cholesterol levels checked more frequently.Ongoing high lipid and cholesterol levels should be treated with medicines if diet and exercise are not working.  If you smoke, find out from your health care provider how to quit. If you do not use tobacco, do not start.  Lung cancer screening is recommended for adults aged 52-80 years who are at high risk for developing lung cancer because of a history of smoking. A yearly low-dose CT scan of the lungs is recommended for people who have at least a 30-pack-year history of smoking and are a current smoker or have quit within the past 15 years. A pack year of smoking is smoking an average of 1 pack of cigarettes a day for 1 year (for example: 1 pack a day for 30 years or 2 packs a day for 15 years). Yearly screening should continue until the smoker has stopped smoking for at least 15 years. Yearly screening should be stopped for people who develop a health problem that would prevent them from having lung cancer treatment.  If you are pregnant, do not drink alcohol. If you are  breastfeeding, be very cautious about drinking alcohol. If you are not pregnant and choose to drink alcohol, do not have more than 1 drink per day. One drink is considered to be 12 ounces (355 mL) of beer, 5 ounces (148 mL) of wine, or 1.5 ounces (44 mL) of liquor.  Avoid use of street drugs. Do not share needles with anyone. Ask for help if you need support or instructions about stopping the use of drugs.  High blood pressure causes heart disease and  increases the risk of stroke. Your blood pressure should be checked at least every 1 to 2 years. Ongoing high blood pressure should be treated with medicines if weight loss and exercise do not work.  If you are 25-78 years old, ask your health care provider if you should take aspirin to prevent strokes.  Diabetes screening is done by taking a blood sample to check your blood glucose level after you have not eaten for a certain period of time (fasting). If you are not overweight and you do not have risk factors for diabetes, you should be screened once every 3 years starting at age 86. If you are overweight or obese and you are 3-87 years of age, you should be screened for diabetes every year as part of your cardiovascular risk assessment.  Breast cancer screening is essential preventive care for women. You should practice "breast self-awareness." This means understanding the normal appearance and feel of your breasts and may include breast self-examination. Any changes detected, no matter how small, should be reported to a health care provider. Women in their 66s and 30s should have a clinical breast exam (CBE) by a health care provider as part of a regular health exam every 1 to 3 years. After age 43, women should have a CBE every year. Starting at age 37, women should consider having a mammogram (breast X-ray test) every year. Women who have a family history of breast cancer should talk to their health care provider about genetic screening. Women at a high risk of breast cancer should talk to their health care providers about having an MRI and a mammogram every year.  Breast cancer gene (BRCA)-related cancer risk assessment is recommended for women who have family members with BRCA-related cancers. BRCA-related cancers include breast, ovarian, tubal, and peritoneal cancers. Having family members with these cancers may be associated with an increased risk for harmful changes (mutations) in the breast  cancer genes BRCA1 and BRCA2. Results of the assessment will determine the need for genetic counseling and BRCA1 and BRCA2 testing.  Your health care provider may recommend that you be screened regularly for cancer of the pelvic organs (ovaries, uterus, and vagina). This screening involves a pelvic examination, including checking for microscopic changes to the surface of your cervix (Pap test). You may be encouraged to have this screening done every 3 years, beginning at age 78.  For women ages 79-65, health care providers may recommend pelvic exams and Pap testing every 3 years, or they may recommend the Pap and pelvic exam, combined with testing for human papilloma virus (HPV), every 5 years. Some types of HPV increase your risk of cervical cancer. Testing for HPV may also be done on women of any age with unclear Pap test results.  Other health care providers may not recommend any screening for nonpregnant women who are considered low risk for pelvic cancer and who do not have symptoms. Ask your health care provider if a screening pelvic exam is right for  you.  If you have had past treatment for cervical cancer or a condition that could lead to cancer, you need Pap tests and screening for cancer for at least 20 years after your treatment. If Pap tests have been discontinued, your risk factors (such as having a new sexual partner) need to be reassessed to determine if screening should resume. Some women have medical problems that increase the chance of getting cervical cancer. In these cases, your health care provider may recommend more frequent screening and Pap tests.  Colorectal cancer can be detected and often prevented. Most routine colorectal cancer screening begins at the age of 50 years and continues through age 75 years. However, your health care provider may recommend screening at an earlier age if you have risk factors for colon cancer. On a yearly basis, your health care provider may provide  home test kits to check for hidden blood in the stool. Use of a small camera at the end of a tube, to directly examine the colon (sigmoidoscopy or colonoscopy), can detect the earliest forms of colorectal cancer. Talk to your health care provider about this at age 50, when routine screening begins. Direct exam of the colon should be repeated every 5-10 years through age 75 years, unless early forms of precancerous polyps or small growths are found.  People who are at an increased risk for hepatitis B should be screened for this virus. You are considered at high risk for hepatitis B if:  You were born in a country where hepatitis B occurs often. Talk with your health care provider about which countries are considered high risk.  Your parents were born in a high-risk country and you have not received a shot to protect against hepatitis B (hepatitis B vaccine).  You have HIV or AIDS.  You use needles to inject street drugs.  You live with, or have sex with, someone who has hepatitis B.  You get hemodialysis treatment.  You take certain medicines for conditions like cancer, organ transplantation, and autoimmune conditions.  Hepatitis C blood testing is recommended for all people born from 1945 through 1965 and any individual with known risks for hepatitis C.  Practice safe sex. Use condoms and avoid high-risk sexual practices to reduce the spread of sexually transmitted infections (STIs). STIs include gonorrhea, chlamydia, syphilis, trichomonas, herpes, HPV, and human immunodeficiency virus (HIV). Herpes, HIV, and HPV are viral illnesses that have no cure. They can result in disability, cancer, and death.  You should be screened for sexually transmitted illnesses (STIs) including gonorrhea and chlamydia if:  You are sexually active and are younger than 24 years.  You are older than 24 years and your health care provider tells you that you are at risk for this type of infection.  Your sexual  activity has changed since you were last screened and you are at an increased risk for chlamydia or gonorrhea. Ask your health care provider if you are at risk.  If you are at risk of being infected with HIV, it is recommended that you take a prescription medicine daily to prevent HIV infection. This is called preexposure prophylaxis (PrEP). You are considered at risk if:  You are sexually active and do not regularly use condoms or know the HIV status of your partner(s).  You take drugs by injection.  You are sexually active with a partner who has HIV.  Talk with your health care provider about whether you are at high risk of being infected with HIV. If   you choose to begin PrEP, you should first be tested for HIV. You should then be tested every 3 months for as long as you are taking PrEP.  Osteoporosis is a disease in which the bones lose minerals and strength with aging. This can result in serious bone fractures or breaks. The risk of osteoporosis can be identified using a bone density scan. Women ages 1 years and over and women at risk for fractures or osteoporosis should discuss screening with their health care providers. Ask your health care provider whether you should take a calcium supplement or vitamin D to reduce the rate of osteoporosis.  Menopause can be associated with physical symptoms and risks. Hormone replacement therapy is available to decrease symptoms and risks. You should talk to your health care provider about whether hormone replacement therapy is right for you.  Use sunscreen. Apply sunscreen liberally and repeatedly throughout the day. You should seek shade when your shadow is shorter than you. Protect yourself by wearing long sleeves, pants, a wide-brimmed hat, and sunglasses year round, whenever you are outdoors.  Once a month, do a whole body skin exam, using a mirror to look at the skin on your back. Tell your health care provider of new moles, moles that have irregular  borders, moles that are larger than a pencil eraser, or moles that have changed in shape or color.  Stay current with required vaccines (immunizations).  Influenza vaccine. All adults should be immunized every year.  Tetanus, diphtheria, and acellular pertussis (Td, Tdap) vaccine. Pregnant women should receive 1 dose of Tdap vaccine during each pregnancy. The dose should be obtained regardless of the length of time since the last dose. Immunization is preferred during the 27th-36th week of gestation. An adult who has not previously received Tdap or who does not know her vaccine status should receive 1 dose of Tdap. This initial dose should be followed by tetanus and diphtheria toxoids (Td) booster doses every 10 years. Adults with an unknown or incomplete history of completing a 3-dose immunization series with Td-containing vaccines should begin or complete a primary immunization series including a Tdap dose. Adults should receive a Td booster every 10 years.  Varicella vaccine. An adult without evidence of immunity to varicella should receive 2 doses or a second dose if she has previously received 1 dose. Pregnant females who do not have evidence of immunity should receive the first dose after pregnancy. This first dose should be obtained before leaving the health care facility. The second dose should be obtained 4-8 weeks after the first dose.  Human papillomavirus (HPV) vaccine. Females aged 13-26 years who have not received the vaccine previously should obtain the 3-dose series. The vaccine is not recommended for use in pregnant females. However, pregnancy testing is not needed before receiving a dose. If a female is found to be pregnant after receiving a dose, no treatment is needed. In that case, the remaining doses should be delayed until after the pregnancy. Immunization is recommended for any person with an immunocompromised condition through the age of 24 years if she did not get any or all doses  earlier. During the 3-dose series, the second dose should be obtained 4-8 weeks after the first dose. The third dose should be obtained 24 weeks after the first dose and 16 weeks after the second dose.  Zoster vaccine. One dose is recommended for adults aged 97 years or older unless certain conditions are present.  Measles, mumps, and rubella (MMR) vaccine. Adults born  before 1957 generally are considered immune to measles and mumps. Adults born in [birth year] or later should have 1 or more doses of MMR vaccine unless there is a contraindication to the vaccine or there is laboratory evidence of immunity to each of the three diseases. A routine second dose of MMR vaccine should be obtained at least 28 days after the first dose for students attending postsecondary schools, health care workers, or international travelers. People who received inactivated measles vaccine or an unknown type of measles vaccine during 1963-1967 should receive 2 doses of MMR vaccine. People who received inactivated mumps vaccine or an unknown type of mumps vaccine before 1979 and are at high risk for mumps infection should consider immunization with 2 doses of MMR vaccine. For females of childbearing age, rubella immunity should be determined. If there is no evidence of immunity, females who are not pregnant should be vaccinated. If there is no evidence of immunity, females who are pregnant should delay immunization until after pregnancy. Unvaccinated health care workers born before 60 who lack laboratory evidence of measles, mumps, or rubella immunity or laboratory confirmation of disease should consider measles and mumps immunization with 2 doses of MMR vaccine or rubella immunization with 1 dose of MMR vaccine.  Pneumococcal 13-valent conjugate (PCV13) vaccine. When indicated, a person who is uncertain of his immunization history and has no record of immunization should receive the PCV13 vaccine. All adults 61 years of age and older  should receive this vaccine. An adult aged 92 years or older who has certain medical conditions and has not been previously immunized should receive 1 dose of PCV13 vaccine. This PCV13 should be followed with a dose of pneumococcal polysaccharide (PPSV23) vaccine. Adults who are at high risk for pneumococcal disease should obtain the PPSV23 vaccine at least 8 weeks after the dose of PCV13 vaccine. Adults older than 50 years of age who have normal immune system function should obtain the PPSV23 vaccine dose at least 1 year after the dose of PCV13 vaccine.  Pneumococcal polysaccharide (PPSV23) vaccine. When PCV13 is also indicated, PCV13 should be obtained first. All adults aged 2 years and older should be immunized. An adult younger than age 30 years who has certain medical conditions should be immunized. Any person who resides in a nursing home or long-term care facility should be immunized. An adult smoker should be immunized. People with an immunocompromised condition and certain other conditions should receive both PCV13 and PPSV23 vaccines. People with human immunodeficiency virus (HIV) infection should be immunized as soon as possible after diagnosis. Immunization during chemotherapy or radiation therapy should be avoided. Routine use of PPSV23 vaccine is not recommended for American Indians, [place] Natives, or people younger than 65 years unless there are medical conditions that require PPSV23 vaccine. When indicated, people who have unknown immunization and have no record of immunization should receive PPSV23 vaccine. One-time revaccination 5 years after the first dose of PPSV23 is recommended for people aged 19-64 years who have chronic kidney failure, nephrotic syndrome, asplenia, or immunocompromised conditions. People who received 1-2 doses of PPSV23 before age 44 years should receive another dose of PPSV23 vaccine at age 83 years or later if at least 5 years have passed since the previous dose. Doses  of PPSV23 are not needed for people immunized with PPSV23 at or after age 20 years.  Meningococcal vaccine. Adults with asplenia or persistent complement component deficiencies should receive 2 doses of quadrivalent meningococcal conjugate (MenACWY-D) vaccine. The doses should be obtained  at least 2 months apart. Microbiologists working with certain meningococcal bacteria, [place] recruits, people at risk during an outbreak, and people who travel to or live in countries with a high rate of meningitis should be immunized. A first-year college student up through age 28 years who is living in a residence hall should receive a dose if she did not receive a dose on or after her 16th birthday. Adults who have certain high-risk conditions should receive one or more doses of vaccine.  Hepatitis A vaccine. Adults who wish to be protected from this disease, have certain high-risk conditions, work with hepatitis A-infected animals, work in hepatitis A research labs, or travel to or work in countries with a high rate of hepatitis A should be immunized. Adults who were previously unvaccinated and who anticipate close contact with an international adoptee during the first 60 days after arrival in the [place] from a country with a high rate of hepatitis A should be immunized.  Hepatitis B vaccine. Adults who wish to be protected from this disease, have certain high-risk conditions, may be exposed to blood or other infectious body fluids, are household contacts or sex partners of hepatitis B positive people, are clients or workers in certain care facilities, or travel to or work in countries with a high rate of hepatitis B should be immunized.  Haemophilus influenzae type b (Hib) vaccine. A previously unvaccinated person with asplenia or sickle cell disease or having a scheduled splenectomy should receive 1 dose of Hib vaccine. Regardless of previous immunization, a recipient of a hematopoietic stem cell transplant  should receive a 3-dose series 6-12 months after her successful transplant. Hib vaccine is not recommended for adults with HIV infection. Preventive Services / Frequency Ages 71 to 87 years  Blood pressure check.** / Every 3-5 years.  Lipid and cholesterol check.** / Every 5 years beginning at age 1.  Clinical breast exam.** / Every 3 years for women in their 3s and 31s.  BRCA-related cancer risk assessment.** / For women who have family members with a BRCA-related cancer (breast, ovarian, tubal, or peritoneal cancers).  Pap test.** / Every 2 years from ages 50 through 86. Every 3 years starting at age 87 through age 7 or 75 with a history of 3 consecutive normal Pap tests.  HPV screening.** / Every 3 years from ages 59 through ages 35 to 6 with a history of 3 consecutive normal Pap tests.  Hepatitis C blood test.** / For any individual with known risks for hepatitis C.  Skin self-exam. / Monthly.  Influenza vaccine. / Every year.  Tetanus, diphtheria, and acellular pertussis (Tdap, Td) vaccine.** / Consult your health care provider. Pregnant women should receive 1 dose of Tdap vaccine during each pregnancy. 1 dose of Td every 10 years.  Varicella vaccine.** / Consult your health care provider. Pregnant females who do not have evidence of immunity should receive the first dose after pregnancy.  HPV vaccine. / 3 doses over 6 months, if 72 and younger. The vaccine is not recommended for use in pregnant females. However, pregnancy testing is not needed before receiving a dose.  Measles, mumps, rubella (MMR) vaccine.** / You need at least 1 dose of MMR if you were born in 1957 or later. You may also need a 2nd dose. For females of childbearing age, rubella immunity should be determined. If there is no evidence of immunity, females who are not pregnant should be vaccinated. If there is no evidence of immunity, females who are  pregnant should delay immunization until after  pregnancy.  Pneumococcal 13-valent conjugate (PCV13) vaccine.** / Consult your health care provider.  Pneumococcal polysaccharide (PPSV23) vaccine.** / 1 to 2 doses if you smoke cigarettes or if you have certain conditions.  Meningococcal vaccine.** / 1 dose if you are age 87 to 44 years and a Market researcher living in a residence hall, or have one of several medical conditions, you need to get vaccinated against meningococcal disease. You may also need additional booster doses.  Hepatitis A vaccine.** / Consult your health care provider.  Hepatitis B vaccine.** / Consult your health care provider.  Haemophilus influenzae type b (Hib) vaccine.** / Consult your health care provider. Ages 86 to 38 years  Blood pressure check.** / Every year.  Lipid and cholesterol check.** / Every 5 years beginning at age 49 years.  Lung cancer screening. / Every year if you are aged 71-80 years and have a 30-pack-year history of smoking and currently smoke or have quit within the past 15 years. Yearly screening is stopped once you have quit smoking for at least 15 years or develop a health problem that would prevent you from having lung cancer treatment.  Clinical breast exam.** / Every year after age 51 years.  BRCA-related cancer risk assessment.** / For women who have family members with a BRCA-related cancer (breast, ovarian, tubal, or peritoneal cancers).  Mammogram.** / Every year beginning at age 18 years and continuing for as long as you are in good health. Consult with your health care provider.  Pap test.** / Every 3 years starting at age 63 years through age 37 or 57 years with a history of 3 consecutive normal Pap tests.  HPV screening.** / Every 3 years from ages 41 years through ages 76 to 23 years with a history of 3 consecutive normal Pap tests.  Fecal occult blood test (FOBT) of stool. / Every year beginning at age 36 years and continuing until age 51 years. You may not need  to do this test if you get a colonoscopy every 10 years.  Flexible sigmoidoscopy or colonoscopy.** / Every 5 years for a flexible sigmoidoscopy or every 10 years for a colonoscopy beginning at age 36 years and continuing until age 35 years.  Hepatitis C blood test.** / For all people born from [birth year] through 1965 and any individual with known risks for hepatitis C.  Skin self-exam. / Monthly.  Influenza vaccine. / Every year.  Tetanus, diphtheria, and acellular pertussis (Tdap/Td) vaccine.** / Consult your health care provider. Pregnant women should receive 1 dose of Tdap vaccine during each pregnancy. 1 dose of Td every 10 years.  Varicella vaccine.** / Consult your health care provider. Pregnant females who do not have evidence of immunity should receive the first dose after pregnancy.  Zoster vaccine.** / 1 dose for adults aged 73 years or older.  Measles, mumps, rubella (MMR) vaccine.** / You need at least 1 dose of MMR if you were born in 1957 or later. You may also need a second dose. For females of childbearing age, rubella immunity should be determined. If there is no evidence of immunity, females who are not pregnant should be vaccinated. If there is no evidence of immunity, females who are pregnant should delay immunization until after pregnancy.  Pneumococcal 13-valent conjugate (PCV13) vaccine.** / Consult your health care provider.  Pneumococcal polysaccharide (PPSV23) vaccine.** / 1 to 2 doses if you smoke cigarettes or if you have certain conditions.  Meningococcal vaccine.** /  Consult your health care provider.  Hepatitis A vaccine.** / Consult your health care provider.  Hepatitis B vaccine.** / Consult your health care provider.  Haemophilus influenzae type b (Hib) vaccine.** / Consult your health care provider. Ages 80 years and over  Blood pressure check.** / Every year.  Lipid and cholesterol check.** / Every 5 years beginning at age 62 years.  Lung cancer  screening. / Every year if you are aged 32-80 years and have a 30-pack-year history of smoking and currently smoke or have quit within the past 15 years. Yearly screening is stopped once you have quit smoking for at least 15 years or develop a health problem that would prevent you from having lung cancer treatment.  Clinical breast exam.** / Every year after age 61 years.  BRCA-related cancer risk assessment.** / For women who have family members with a BRCA-related cancer (breast, ovarian, tubal, or peritoneal cancers).  Mammogram.** / Every year beginning at age 39 years and continuing for as long as you are in good health. Consult with your health care provider.  Pap test.** / Every 3 years starting at age 85 years through age 74 or 72 years with 3 consecutive normal Pap tests. Testing can be stopped between 65 and 70 years with 3 consecutive normal Pap tests and no abnormal Pap or HPV tests in the past 10 years.  HPV screening.** / Every 3 years from ages 55 years through ages 67 or 77 years with a history of 3 consecutive normal Pap tests. Testing can be stopped between 65 and 70 years with 3 consecutive normal Pap tests and no abnormal Pap or HPV tests in the past 10 years.  Fecal occult blood test (FOBT) of stool. / Every year beginning at age 81 years and continuing until age 22 years. You may not need to do this test if you get a colonoscopy every 10 years.  Flexible sigmoidoscopy or colonoscopy.** / Every 5 years for a flexible sigmoidoscopy or every 10 years for a colonoscopy beginning at age 67 years and continuing until age 22 years.  Hepatitis C blood test.** / For all people born from [birth year] through 1965 and any individual with known risks for hepatitis C.  Osteoporosis screening.** / A one-time screening for women ages 8 years and over and women at risk for fractures or osteoporosis.  Skin self-exam. / Monthly.  Influenza vaccine. / Every year.  Tetanus, diphtheria, and  acellular pertussis (Tdap/Td) vaccine.** / 1 dose of Td every 10 years.  Varicella vaccine.** / Consult your health care provider.  Zoster vaccine.** / 1 dose for adults aged 56 years or older.  Pneumococcal 13-valent conjugate (PCV13) vaccine.** / Consult your health care provider.  Pneumococcal polysaccharide (PPSV23) vaccine.** / 1 dose for all adults aged 15 years and older.  Meningococcal vaccine.** / Consult your health care provider.  Hepatitis A vaccine.** / Consult your health care provider.  Hepatitis B vaccine.** / Consult your health care provider.  Haemophilus influenzae type b (Hib) vaccine.** / Consult your health care provider. ** Family history and personal history of risk and conditions may change your health care provider's recommendations.   This information is not intended to replace advice given to you by your health care provider. Make sure you discuss any questions you have with your health care provider.   Document Released: 01/02/2002 Document Revised: 11/27/2014 Document Reviewed: 04/03/2011 Elsevier Interactive Patient Education [employer].

## 2016-07-04 ENCOUNTER — Encounter: Payer: Self-pay | Admitting: Family

## 2016-07-11 ENCOUNTER — Ambulatory Visit: Payer: Self-pay | Admitting: Family

## 2016-07-18 ENCOUNTER — Encounter: Payer: Self-pay | Admitting: Family

## 2016-07-18 ENCOUNTER — Ambulatory Visit (INDEPENDENT_AMBULATORY_CARE_PROVIDER_SITE_OTHER): Payer: Managed Care, Other (non HMO) | Admitting: Family

## 2016-07-18 VITALS — BP 146/82 | HR 91 | Temp 98.7°F | Wt 139.2 lb

## 2016-07-18 DIAGNOSIS — F411 Generalized anxiety disorder: Secondary | ICD-10-CM

## 2016-07-18 DIAGNOSIS — I10 Essential (primary) hypertension: Secondary | ICD-10-CM | POA: Diagnosis not present

## 2016-07-18 MED ORDER — BUPROPION HCL ER (XL) 150 MG PO TB24: 150.0000 mg | ORAL_TABLET | Freq: Every day | ORAL | 1 refills | Status: DC

## 2016-07-18 MED ORDER — SERTRALINE HCL 100 MG PO TABS: 100.0000 mg | ORAL_TABLET | Freq: Every day | ORAL | 3 refills | Status: DC

## 2016-07-18 NOTE — Assessment & Plan Note (Addendum)
Elevated today. Advised patient to keep BP log at home and to purchase BP monitor. Will check at f/u and consider adding another medication if > 140/90. Patient understood my concerns of being on OCP with BP not at goal with risk of CVA, MI increased.

## 2016-07-18 NOTE — Progress Notes (Signed)
Subjective:    Patient ID: Meghan Sandoval, female    DOB: 1966/03/25, 50 y.o.   MRN: [id number]  CC: Meghan Sandoval is a 50 y.o. female who presents today for follow up.   HPI: Patient here for follow up and to establish care.   Anxiety-  Increased anxiety largely due to increase of workload. Increased sertraline 2 weeks ago from 50 mg to 75mg . She notes feeling more anxious however may be more work related than medication dose related. Uses xanax rarely at bedtime. [facility].   Insomnia -Difficulty falling asleep for a long time.  takes 125mg  benadryl every night to sleep. Notes heart races which she notes may be medication or anxiety related. No chest pain.   HTN- Occasional takes BP at home. Complaint with medication. Denies exertional chest pain or pressure, numbness or tingling radiating to left arm or jaw, palpitations, dizziness, frequent headaches, changes in vision, or shortness of breath.   OCP: Prescribed by OB GYN. Wants to stay on OCP as still sexually active and does not want to have more children.    Preventative: follows with CNM for mammogram ( UTD) and Pap ( UTD).      HISTORY:  Past Medical History:  Diagnosis Date  . Anxiety   . Hypercholesteremia   . Hypertension    Past Surgical History:  Procedure Laterality Date  . no past surgery     Family History  Problem Relation Age of Onset  . Other Mother     gastrointestinal  . Breast cancer Neg Hx     Allergies: Review of patient's allergies indicates no known allergies. Current Outpatient Prescriptions on File Prior to Visit  Medication Sig Dispense Refill  . ALPRAZolam (XANAX) 0.5 MG tablet Take 1 tablet (0.5 mg total) by mouth at bedtime as needed for anxiety. 60 tablet 1  . losartan-hydrochlorothiazide (HYZAAR) 50-12.5 MG tablet Take 1 tablet by mouth daily. 90 tablet 4  . norethindrone-ethinyl estradiol (MICROGESTIN,JUNEL,LOESTRIN) 1-20 MG-MCG tablet Take 1 tablet by mouth  daily. 4 Package 4   No current facility-administered medications on file prior to visit.     Social History  Substance Use Topics  . Smoking status: Never Smoker  . Smokeless tobacco: Never Used  . Alcohol use 0.0 oz/week     Comment: occasionally    Review of Systems  Constitutional: Negative for chills and fever.  Respiratory: Negative for cough, chest tightness, shortness of breath and wheezing.   Cardiovascular: Negative for chest pain, palpitations and leg swelling.  Gastrointestinal: Negative for nausea and vomiting.  Neurological: Negative for dizziness.  Psychiatric/Behavioral: The patient is nervous/anxious.       Objective:    BP (!) 146/82   Pulse 91   Temp 98.7 F (37.1 C) (Oral)   Wt 139 lb 3.2 oz (63.1 kg)   SpO2 98%   BMI 25.46 kg/m  BP Readings from Last 3 Encounters:  07/18/16 (!) 146/82  06/28/16 (!) 160/102  06/09/16 (!) 158/90   Wt Readings from Last 3 Encounters:  07/18/16 139 lb 3.2 oz (63.1 kg)  06/28/16 137 lb 14.4 oz (62.6 kg)  06/09/16 137 lb 12.8 oz (62.5 kg)    Physical Exam  Constitutional: She appears well-developed and well-nourished.  Eyes: Conjunctivae are normal.  Cardiovascular: Normal rate, regular rhythm, normal heart sounds and normal pulses.   Pulmonary/Chest: Effort normal and breath sounds normal. She has no wheezes. She has no rhonchi. She has no  rales.  Neurological: She is alert.  Skin: Skin is warm and dry.  Psychiatric: She has a normal mood and affect. Her speech is normal and behavior is normal. Thought content normal.  Vitals reviewed.      Assessment & Plan:   Problem List Items Addressed This Visit      Cardiovascular and Mediastinum   Benign essential HTN    Elevated today. Advised patient to keep BP log at home and to purchase BP monitor. Will check at f/u and consider adding another medication if > 140/90. Patient understood my concerns of being on OCP with BP not at goal with risk of CVA, MI  increased.         Other   GAD (generalized anxiety disorder) - Primary    On sertraline 75mg  and increased to 100mg  to optimize control of anxiety. Advised to stop using benadryl entirely and would prefer patient to use Xanax as prescribed at bedtime due to palpitations which may be side effect of benadryl. Concern that Wellbutrin may be activating and causing anxiety. Decreased Wellbutrin to 150mg  from 300mg  with the plan to stop completely by titration. Plan to decrease use of xanax and start safer alternative, atarax, at follow up in one month. Discussed risk profile of BZDs and may concern with long term use.       Relevant Medications   buPROPion (WELLBUTRIN XL) 150 MG 24 hr tablet   sertraline (ZOLOFT) 100 MG tablet    Other Visit Diagnoses   None.      I have changed Meghan Sandoval's buPROPion and sertraline. I am also having her maintain her ALPRAZolam, losartan-hydrochlorothiazide, and norethindrone-ethinyl estradiol.   Meds ordered this encounter  Medications  . buPROPion (WELLBUTRIN XL) 150 MG 24 hr tablet    Sig: Take 1 tablet (150 mg total) by mouth daily.    Dispense:  30 tablet    Refill:  1    Order Specific Question:   Supervising Provider    Answer:   Duncan Dull L [[id number]]  . sertraline (ZOLOFT) 100 MG tablet    Sig: Take 1 tablet (100 mg total) by mouth daily.    Dispense:  30 tablet    Refill:  3    Order Specific Question:   Supervising Provider    Answer:   Sherlene Shams [[id number]]    Return precautions given.   Risks, benefits, and alternatives of the medications and treatment plan prescribed today were discussed, and patient expressed understanding.   Education regarding symptom management and diagnosis given to patient on AVS.  Continue to follow with Rennie Plowman, FNP for routine health maintenance.   Meghan Mans and I agreed with plan.   Rennie Plowman, FNP   Total of 25 minutes spent with patient, greater than 50% of which was spent in  discussion of  Anxiety.

## 2016-07-18 NOTE — Progress Notes (Signed)
Pre visit review using our clinic review tool, if applicable. No additional management support is needed unless otherwise documented below in the visit note. 

## 2016-07-18 NOTE — Patient Instructions (Addendum)
Please stop taking Benadryl at bedtime,it  is a very high dose and I'm concerned is contributing to heart palpitations. I would rather you take Xanax as prescribed.   Decrease Wellbutrin.   Increase  Zoloft, 100 mg.  Please buy BP cuff and monitor as I have concern for stroke if BP is < 140/90 when you are on birth control.  If there is no improvement in your symptoms, or if there is any worsening of symptoms, or if you have any additional concerns, please return for re-evaluation; or, if we are closed, consider going to the Emergency Room for evaluation if symptoms urgent.

## 2016-07-18 NOTE — Assessment & Plan Note (Addendum)
On sertraline 75mg  and increased to 100mg  to optimize control of anxiety. Advised to stop using benadryl entirely and would prefer patient to use Xanax as prescribed at bedtime due to palpitations which may be side effect of benadryl. Concern that Wellbutrin may be activating and causing anxiety. Decreased Wellbutrin to 150mg  from 300mg  with the plan to stop completely by titration. Plan to decrease use of xanax and start safer alternative, atarax, at follow up in one month. Discussed risk profile of BZDs and may concern with long term use.

## 2016-08-21 ENCOUNTER — Other Ambulatory Visit: Payer: Self-pay | Admitting: Emergency Medicine

## 2016-08-21 MED ORDER — MELOXICAM 15 MG PO TABS: 15.0000 mg | ORAL_TABLET | Freq: Every day | ORAL | 5 refills | Status: DC

## 2016-08-21 NOTE — Telephone Encounter (Signed)
Refill approved.

## 2016-08-24 ENCOUNTER — Encounter: Payer: Self-pay | Admitting: Family

## 2016-08-24 ENCOUNTER — Ambulatory Visit (INDEPENDENT_AMBULATORY_CARE_PROVIDER_SITE_OTHER): Payer: Managed Care, Other (non HMO) | Admitting: Family

## 2016-08-24 VITALS — BP 132/84 | HR 79 | Temp 98.0°F | Wt 139.0 lb

## 2016-08-24 DIAGNOSIS — I10 Essential (primary) hypertension: Secondary | ICD-10-CM

## 2016-08-24 DIAGNOSIS — F411 Generalized anxiety disorder: Secondary | ICD-10-CM

## 2016-08-24 MED ORDER — HYDROXYZINE HCL 50 MG PO TABS: 50.0000 mg | ORAL_TABLET | Freq: Every evening | ORAL | 2 refills | Status: DC | PRN

## 2016-08-24 NOTE — Assessment & Plan Note (Signed)
At goal today. No symptoms. Will continue to monitor.

## 2016-08-24 NOTE — Progress Notes (Signed)
Subjective:    Patient ID: Meghan Sandoval, female    DOB: 17-Jan-1966, 50 y.o.   MRN: [id number]  CC: Meghan Sandoval is a 50 y.o. female who presents today for follow up.   HPI: Patient follow-up on hypertension and anxiety.  She has not been checking her blood pressure at home.Denies exertional chest pain or pressure, numbness or tingling radiating to left arm or jaw, palpitations, dizziness, frequent headaches, changes in vision, or shortness of breath.   Increased her Zoloft 5 mg 100 mg at last visit. She is 'doing well' and best her anxiety has been in years.We also decreased Wellbutrin with the plan to decrease Xanax.   She still complains of trouble sleeping and has had to take her Xanax on occasion with Benadryl at night.           HISTORY:  Past Medical History:  Diagnosis Date  . Anxiety   . Hypercholesteremia   . Hypertension    Past Surgical History:  Procedure Laterality Date  . no past surgery     Family History  Problem Relation Age of Onset  . Other Mother     gastrointestinal  . Breast cancer Neg Hx     Allergies: Review of patient's allergies indicates no known allergies. Current Outpatient Prescriptions on File Prior to Visit  Medication Sig Dispense Refill  . ALPRAZolam (XANAX) 0.5 MG tablet Take 1 tablet (0.5 mg total) by mouth at bedtime as needed for anxiety. 60 tablet 1  . losartan-hydrochlorothiazide (HYZAAR) 50-12.5 MG tablet Take 1 tablet by mouth daily. 90 tablet 4  . meloxicam (MOBIC) 15 MG tablet Take 1 tablet (15 mg total) by mouth daily. 30 tablet 5  . norethindrone-ethinyl estradiol (MICROGESTIN,JUNEL,LOESTRIN) 1-20 MG-MCG tablet Take 1 tablet by mouth daily. 4 Package 4  . sertraline (ZOLOFT) 100 MG tablet Take 1 tablet (100 mg total) by mouth daily. 30 tablet 3  . buPROPion (WELLBUTRIN XL) 150 MG 24 hr tablet Take 1 tablet (150 mg total) by mouth daily. 30 tablet 1   No current facility-administered medications on file prior to  visit.     Social History  Substance Use Topics  . Smoking status: Never Smoker  . Smokeless tobacco: Never Used  . Alcohol use 0.0 oz/week     Comment: occasionally    Review of Systems  Constitutional: Negative for chills and fever.  Eyes: Negative for visual disturbance.  Respiratory: Negative for cough.   Cardiovascular: Negative for chest pain and palpitations.  Gastrointestinal: Negative for nausea and vomiting.  Neurological: Negative for headaches.  Psychiatric/Behavioral: Positive for sleep disturbance. The patient is not nervous/anxious.       Objective:    BP 132/84   Pulse 79   Temp 98 F (36.7 C) (Oral)   Wt 139 lb (63 kg)   SpO2 98%   BMI 25.42 kg/m  BP Readings from Last 3 Encounters:  08/24/16 132/84  07/18/16 (!) 146/82  06/28/16 (!) 160/102   Wt Readings from Last 3 Encounters:  08/24/16 139 lb (63 kg)  07/18/16 139 lb 3.2 oz (63.1 kg)  06/28/16 137 lb 14.4 oz (62.6 kg)    Physical Exam  Constitutional: She appears well-developed and well-nourished.  Eyes: Conjunctivae are normal.  Cardiovascular: Normal rate, regular rhythm, normal heart sounds and normal pulses.   Pulmonary/Chest: Effort normal and breath sounds normal. She has no wheezes. She has no rhonchi. She has no rales.  Neurological: She is alert.  Skin: Skin is  warm and dry.  Psychiatric: She has a normal mood and affect. Her speech is normal and behavior is normal. Thought content normal.  Vitals reviewed.      Assessment & Plan:   Problem List Items Addressed This Visit      Cardiovascular and Mediastinum   Benign essential HTN    At goal today. No symptoms. Will continue to monitor.        Other   GAD (generalized anxiety disorder)    Controlled. Doing well on zoloft. Still having problems with sleep.Trial of Atarax at bedtime for sleep. Trial of stopping wellbutrin. Advised not take Benadryl or Xanax with Atarax. Patient will call and let me know how she is doing.        Other Visit Diagnoses    Generalized anxiety disorder    -  Primary   Relevant Medications   hydrOXYzine (ATARAX/VISTARIL) 50 MG tablet       I am having [name] start on hydrOXYzine. I am also having her maintain her ALPRAZolam, losartan-hydrochlorothiazide, norethindrone-ethinyl estradiol, buPROPion, sertraline, and meloxicam.   Meds ordered this encounter  Medications  . hydrOXYzine (ATARAX/VISTARIL) 50 MG tablet    Sig: Take 1 tablet (50 mg total) by mouth at bedtime as needed for anxiety (insomnia). Take 30 to 60 minutes before bedtime.    Dispense:  30 tablet    Refill:  2    Order Specific Question:   Supervising Provider    Answer:   Sherlene Shams [[id number]]    Return precautions given.   Risks, benefits, and alternatives of the medications and treatment plan prescribed today were discussed, and patient expressed understanding.   Education regarding symptom management and diagnosis given to patient on AVS.  Continue to follow with Rennie Plowman, FNP for routine health maintenance.   Meghan Mans and I agreed with plan.   Rennie Plowman, FNP

## 2016-08-24 NOTE — Patient Instructions (Signed)
Trial of Atarax at bedtime for sleep. Trial of stopping wellbutrin. Please do not take Benadryl or Xanax with this medication.  Let me know how you are on this new regimen.

## 2016-08-24 NOTE — Assessment & Plan Note (Signed)
Controlled. Doing well on zoloft. Still having problems with sleep.Trial of Atarax at bedtime for sleep. Trial of stopping wellbutrin. Advised not take Benadryl or Xanax with Atarax. Patient will call and let me know how she is doing.

## 2016-08-30 ENCOUNTER — Other Ambulatory Visit: Payer: Self-pay | Admitting: Family

## 2016-08-30 ENCOUNTER — Encounter: Payer: Self-pay | Admitting: Family

## 2016-09-08 ENCOUNTER — Encounter: Payer: Self-pay | Admitting: Family

## 2016-09-08 ENCOUNTER — Other Ambulatory Visit: Payer: Self-pay | Admitting: Family

## 2016-09-08 DIAGNOSIS — F411 Generalized anxiety disorder: Secondary | ICD-10-CM

## 2016-09-08 MED ORDER — BUPROPION HCL ER (XL) 150 MG PO TB24: 150.0000 mg | ORAL_TABLET | Freq: Every day | ORAL | 1 refills | Status: DC

## 2016-09-13 ENCOUNTER — Other Ambulatory Visit: Payer: Self-pay | Admitting: Family

## 2016-09-13 DIAGNOSIS — F411 Generalized anxiety disorder: Secondary | ICD-10-CM

## 2016-09-27 ENCOUNTER — Encounter: Payer: Self-pay | Admitting: Family

## 2016-10-01 ENCOUNTER — Other Ambulatory Visit: Payer: Self-pay | Admitting: Family

## 2016-10-01 DIAGNOSIS — G4700 Insomnia, unspecified: Secondary | ICD-10-CM

## 2016-10-02 ENCOUNTER — Ambulatory Visit
Admission: RE | Admit: 2016-10-02 | Discharge: 2016-10-02 | Disposition: A | Discharge status: Home / Self Care | Payer: Managed Care, Other (non HMO) | Source: Ambulatory Visit | Attending: Obstetrics and Gynecology | Admitting: Obstetrics and Gynecology

## 2016-10-02 DIAGNOSIS — Z01419 Encounter for gynecological examination (general) (routine) without abnormal findings: Secondary | ICD-10-CM

## 2016-10-02 DIAGNOSIS — Z1231 Encounter for screening mammogram for malignant neoplasm of breast: Secondary | ICD-10-CM | POA: Diagnosis not present

## 2016-10-17 IMAGING — MG MM DIAG BREAST TOMO BILATERAL
8 of 12 series · 8 of 28 positions shown · non-contrast
Comparison: Previous exam(s).

CLINICAL DATA: Short-term interval followup of a probable benign
lesion in the left breast.

EXAM:
DIGITAL DIAGNOSTIC BILATERAL MAMMOGRAM WITH 3D TOMOSYNTHESIS AND CAD

[L MLO]
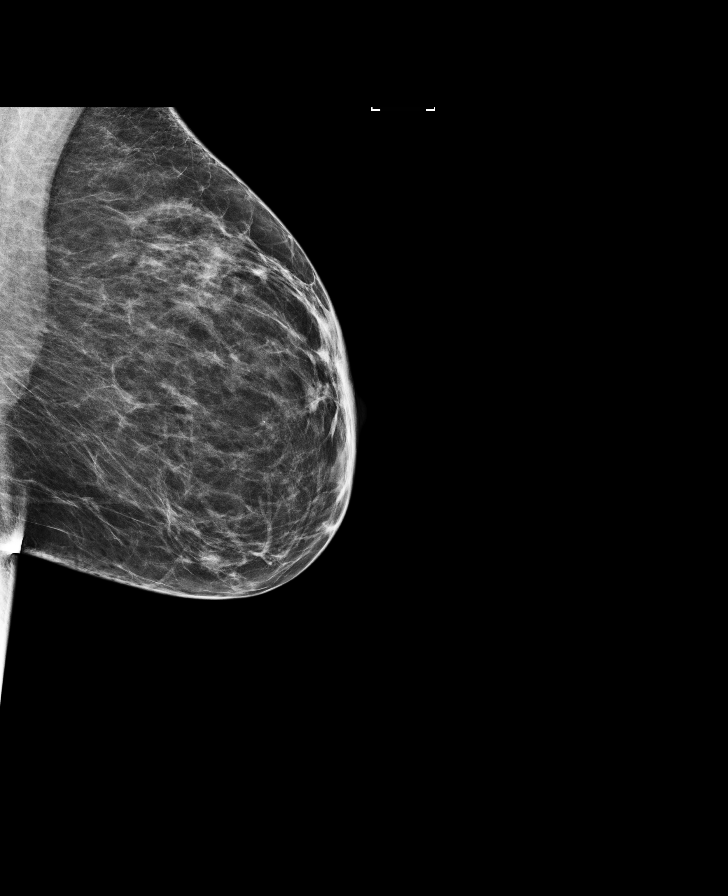

[R MLO synth-2D]
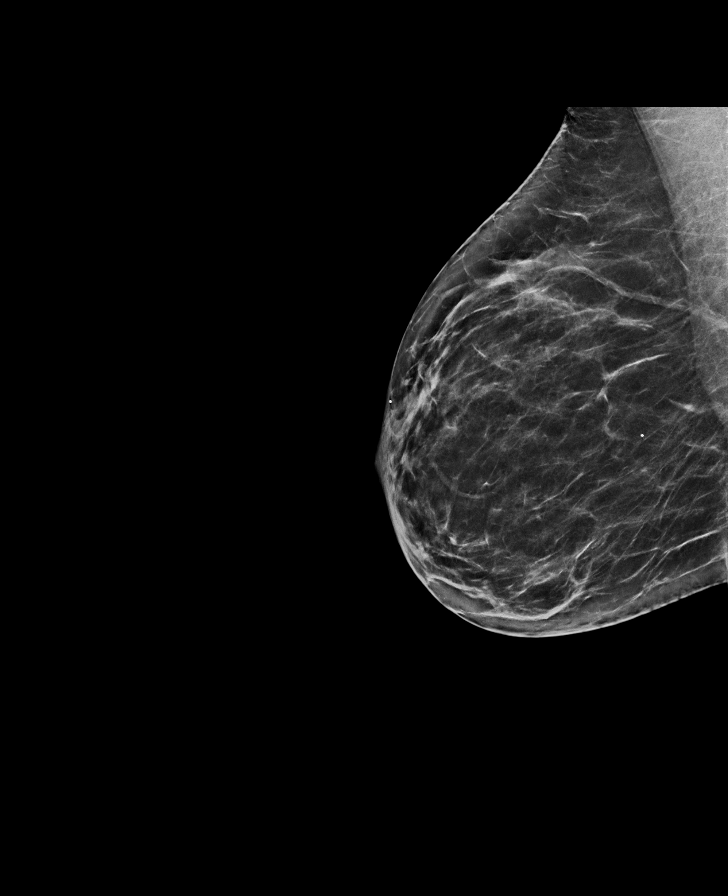

[L MLO synth-2D]
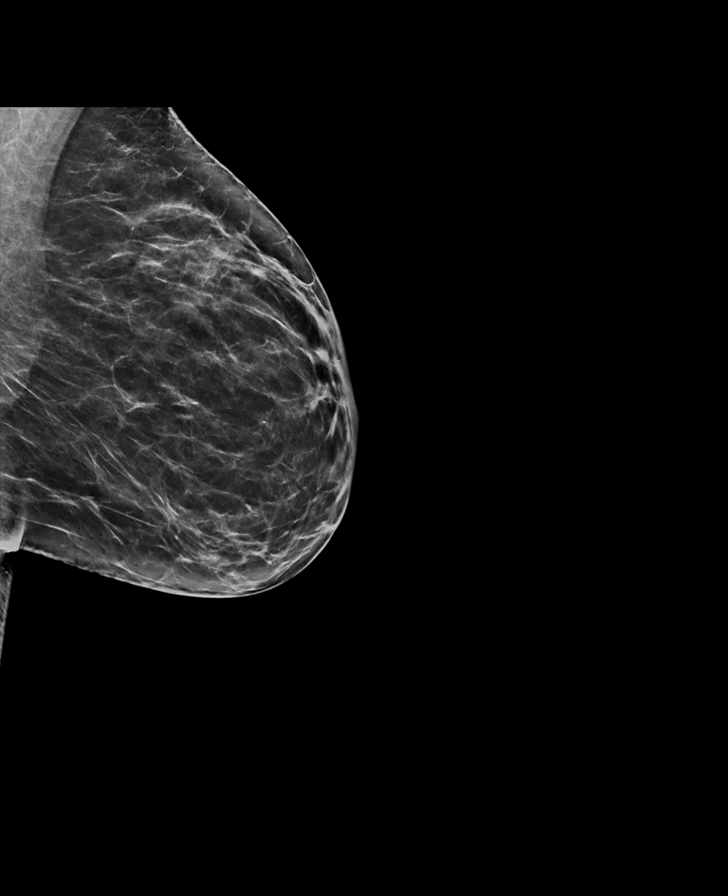

[R MLO]
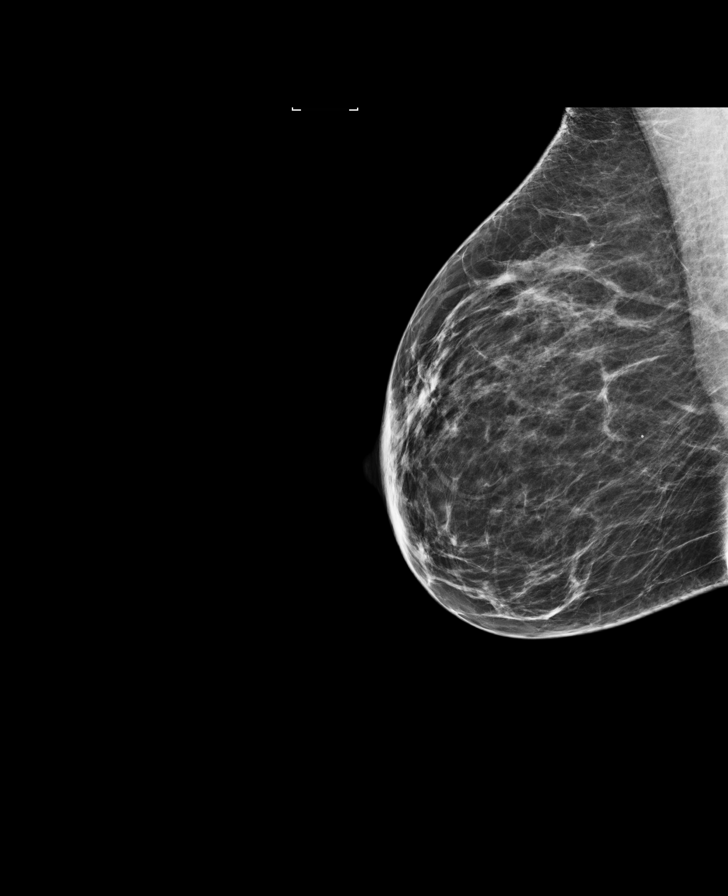

[R CC]
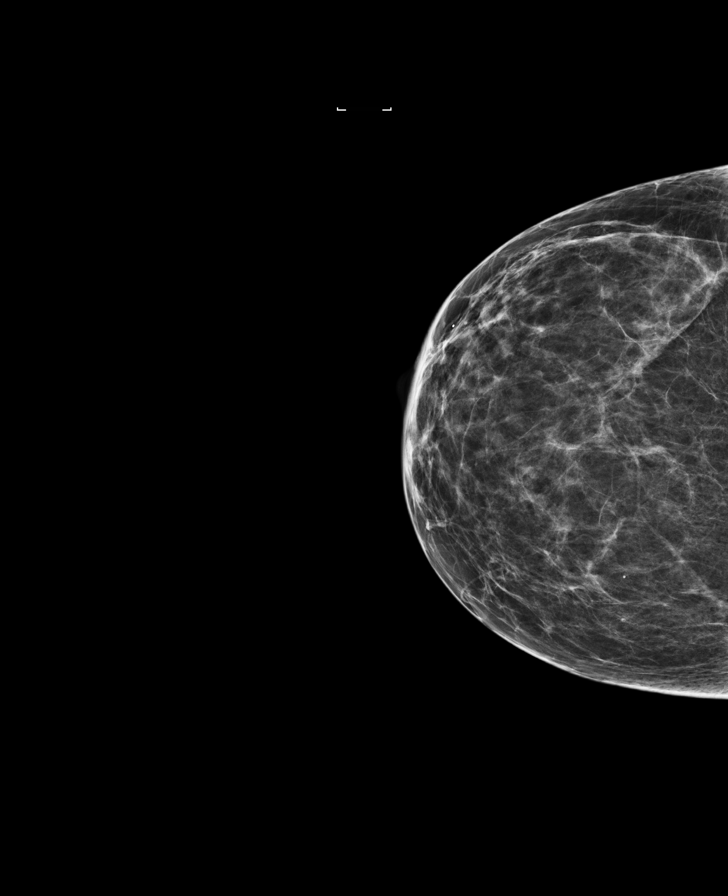

[L CC]
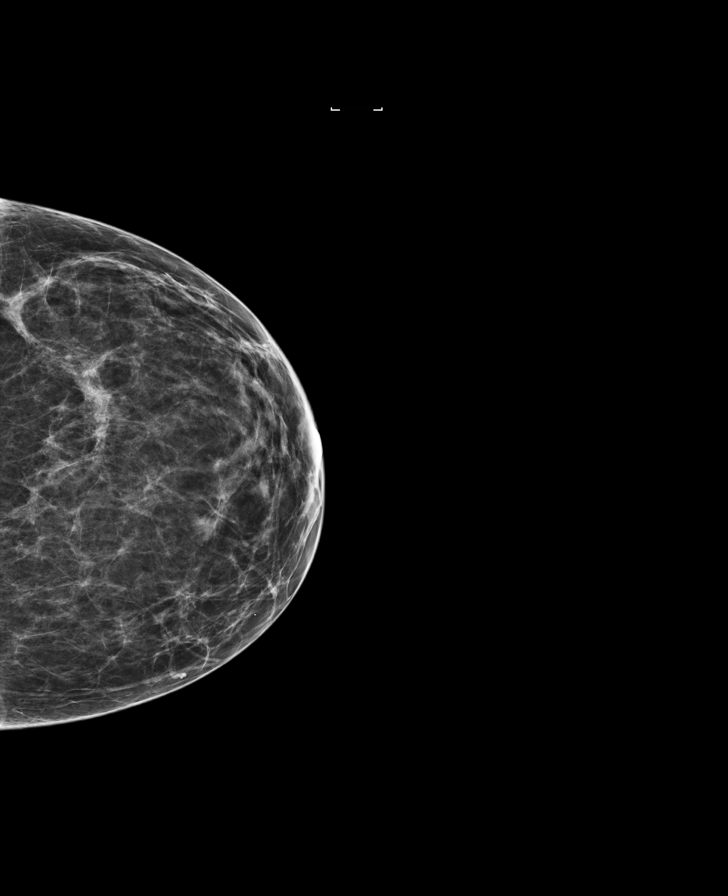

[R CC synth-2D]
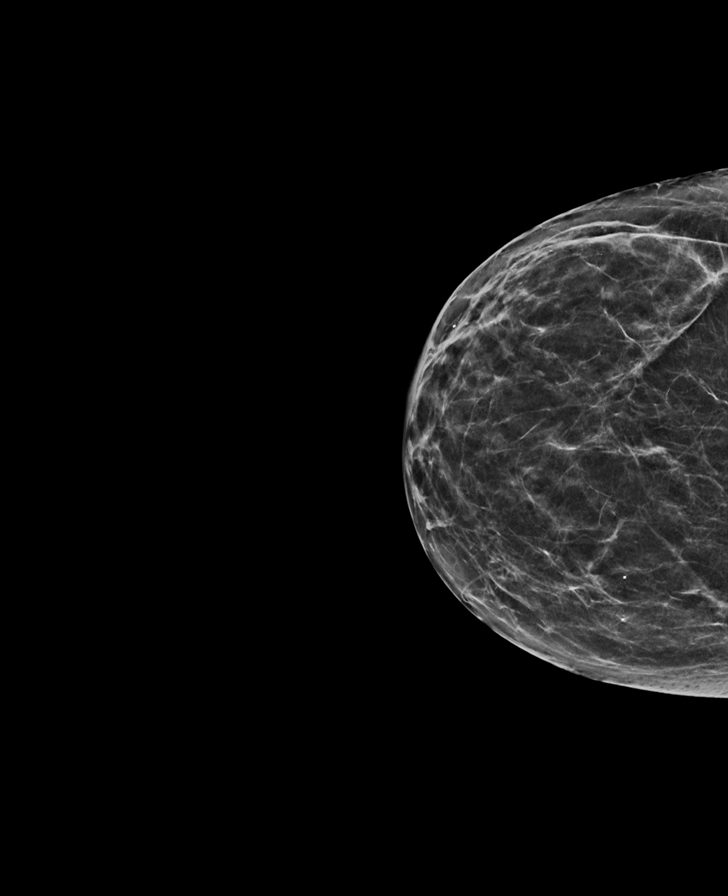

[L CC synth-2D]
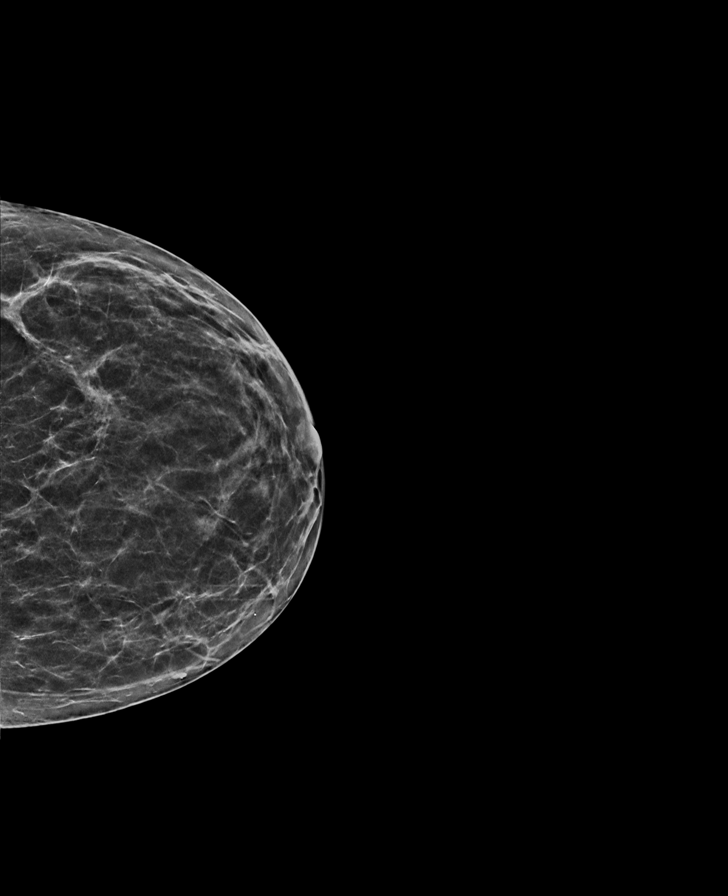

[8 of 28 positions shown; findings below may reference images not displayed]

ACR Breast Density Category b: There are scattered areas of
fibroglandular density.
FINDINGS: No suspicious mass, malignant type microcalcifications or distortion
detected in either breast. The previously seen probable benign area
of nodularity in the left breast is not apparent on today's exam.

Mammographic images were processed with CAD.
IMPRESSION: No evidence of malignancy in either breast.

RECOMMENDATION:
Bilateral screening mammogram in 1 year is recommended.

I have discussed the findings and recommendations with the patient.
Results were also provided in writing at the conclusion of the
visit. If applicable, a reminder letter will be sent to the patient
regarding the next appointment.

BI-RADS CATEGORY  1: Negative.

## 2016-10-25 ENCOUNTER — Telehealth: Payer: Self-pay

## 2016-10-25 ENCOUNTER — Encounter: Payer: Self-pay | Admitting: Family

## 2016-10-25 DIAGNOSIS — F411 Generalized anxiety disorder: Secondary | ICD-10-CM

## 2016-10-25 MED ORDER — SERTRALINE HCL 100 MG PO TABS: 100.0000 mg | ORAL_TABLET | Freq: Every day | ORAL | 3 refills | Status: DC

## 2016-10-25 MED ORDER — BUPROPION HCL ER (XL) 150 MG PO TB24: 150.0000 mg | ORAL_TABLET | Freq: Every day | ORAL | 1 refills | Status: DC

## 2016-10-25 NOTE — Telephone Encounter (Signed)
Medication has been refilled.

## 2016-11-15 ENCOUNTER — Other Ambulatory Visit: Payer: Self-pay | Admitting: Family

## 2016-11-15 DIAGNOSIS — F411 Generalized anxiety disorder: Secondary | ICD-10-CM

## 2016-11-18 ENCOUNTER — Other Ambulatory Visit: Payer: Self-pay | Admitting: Family

## 2016-11-18 DIAGNOSIS — F411 Generalized anxiety disorder: Secondary | ICD-10-CM

## 2016-12-05 ENCOUNTER — Encounter: Payer: Self-pay | Admitting: Physician Assistant

## 2016-12-05 ENCOUNTER — Ambulatory Visit: Payer: Self-pay | Admitting: Physician Assistant

## 2016-12-05 VITALS — BP 110/79 | HR 88 | Temp 98.9°F

## 2016-12-05 DIAGNOSIS — M79602 Pain in left arm: Secondary | ICD-10-CM

## 2016-12-05 DIAGNOSIS — R5383 Other fatigue: Secondary | ICD-10-CM

## 2016-12-05 LAB — POCT INFLUENZA A/B
INFLUENZA A, POC: NEGATIVE
Influenza B, POC: NEGATIVE

## 2016-12-05 NOTE — Progress Notes (Signed)
Patient was referred to see Dr. Drinda ButtsKresinski on 12/11/16 at 3:30 at [facility].  Patient has been contacted and has accepted the appointment.

## 2016-12-05 NOTE — Progress Notes (Signed)
S: c/o being tired and achey, no fever/chills, states her granddaughter has the norovirus, denies v/d, denies cough congestion ; states she feels like she's getting sick but doesn't have the same sx the child does; also left arm has been hurting for awhile, pain in located above elbow and will occasionally move to area at wrist, no numbness or tingling  O: vitals wnl, nad, ENT wnl, neck supple no lymph, lungs c t a, cv rrr, left arm tender a distal bicep insertion, full rom of arm, n/v intact, small lump at distal bicep  A: fatigue, arm pain  P: reassurance, f/u with ortho for arm pain

## 2016-12-14 ENCOUNTER — Ambulatory Visit: Payer: Self-pay | Admitting: Physician Assistant

## 2016-12-14 VITALS — BP 120/80 | HR 83 | Temp 99.0°F

## 2016-12-14 DIAGNOSIS — J209 Acute bronchitis, unspecified: Secondary | ICD-10-CM

## 2016-12-14 MED ORDER — HYDROCOD POLST-CPM POLST ER 10-8 MG/5ML PO SUER: 5.0000 mL | Freq: Two times a day (BID) | ORAL | 0 refills | Status: DC | PRN

## 2016-12-14 MED ORDER — LEVOFLOXACIN 500 MG PO TABS
500.0000 mg | ORAL_TABLET | Freq: Every day | ORAL | 0 refills | Status: DC
Start: 2016-12-14 — End: 2017-04-23

## 2016-12-14 NOTE — Progress Notes (Signed)
S: c/o continued cough and congestion, some low grade fever, mucus is thick and green, no cp/sob, no v/d, sx for over 2 weeks  O: vitals wnl, nad, tms clear, nasal mucosa inflamed, throat wnl, neck supple no lymph, lungs c t a, cv rrr,   A: acute uri, bronchitis  P: levaquin 500mg  qd x 10d, tussionex 150ml nr

## 2017-02-01 ENCOUNTER — Ambulatory Visit (INDEPENDENT_AMBULATORY_CARE_PROVIDER_SITE_OTHER): Payer: Managed Care, Other (non HMO) | Admitting: Family

## 2017-02-01 ENCOUNTER — Encounter: Payer: Self-pay | Admitting: Family

## 2017-02-01 VITALS — BP 118/92 | HR 88 | Temp 99.3°F | Resp 16 | Wt 144.2 lb

## 2017-02-01 DIAGNOSIS — E785 Hyperlipidemia, unspecified: Secondary | ICD-10-CM

## 2017-02-01 DIAGNOSIS — J40 Bronchitis, not specified as acute or chronic: Secondary | ICD-10-CM | POA: Insufficient documentation

## 2017-02-01 DIAGNOSIS — F411 Generalized anxiety disorder: Secondary | ICD-10-CM | POA: Diagnosis not present

## 2017-02-01 DIAGNOSIS — I10 Essential (primary) hypertension: Secondary | ICD-10-CM | POA: Diagnosis not present

## 2017-02-01 LAB — POCT GLYCOSYLATED HEMOGLOBIN (HGB A1C): Hemoglobin A1C: 5.2

## 2017-02-01 MED ORDER — HYDROCOD POLST-CPM POLST ER 10-8 MG/5ML PO SUER: 5.0000 mL | Freq: Two times a day (BID) | ORAL | 0 refills | Status: DC | PRN

## 2017-02-01 MED ORDER — SERTRALINE HCL 50 MG PO TABS: 50.0000 mg | ORAL_TABLET | Freq: Every day | ORAL | 1 refills | Status: DC

## 2017-02-01 MED ORDER — BUPROPION HCL ER (XL) 300 MG PO TB24: 300.0000 mg | ORAL_TABLET | ORAL | 1 refills | Status: DC

## 2017-02-01 NOTE — Patient Instructions (Addendum)
Increase Wellbutrin to 300 mg in the morning. may stay at 50 mg of Zoloft.   Let me know how this works.  Labs today.  Cough syrup at bedtime; let me know if not better

## 2017-02-01 NOTE — Progress Notes (Signed)
Subjective:    Patient ID: Meghan Sandoval, female    DOB: 1966/11/20, 51 y.o.   MRN: [id number]  CC: Meghan Sandoval [name] is a 51 y.o. female who presents today for follow up.   HPI: Depression- Weight gain and fatigue from zoloft. Has been taking 50mg  zoloft and feels like a 'good dose'. Notes stress from work.   Has cough for 2 weeks, better. Sore throat has also improved.  No fever, sob, wheezing. Endorses fatigue past 2 weeks. Has been using nyquil with minimal relief, cough worse at night,   Follows with physical with CNM ( mammogram, breast exam, pap). Due for screening labs.     total cholesterol 285, HDL 65 at work last week.   HISTORY:  Past Medical History:  Diagnosis Date  . Anxiety   . Hypercholesteremia   . Hypertension    Past Surgical History:  Procedure Laterality Date  . no past surgery     Family History  Problem Relation Age of Onset  . Other Mother     gastrointestinal  . Breast cancer Neg Hx     Allergies: Patient has no known allergies. Current Outpatient Prescriptions on File Prior to Visit  Medication Sig Dispense Refill  . ALPRAZolam (XANAX) 0.5 MG tablet Take 1 tablet (0.5 mg total) by mouth at bedtime as needed for anxiety. 60 tablet 1  . losartan-hydrochlorothiazide (HYZAAR) 50-12.5 MG tablet Take 1 tablet by mouth daily. 90 tablet 4  . meloxicam (MOBIC) 15 MG tablet Take 1 tablet (15 mg total) by mouth daily. 30 tablet 5  . norethindrone-ethinyl estradiol (MICROGESTIN,JUNEL,LOESTRIN) 1-20 MG-MCG tablet Take 1 tablet by mouth daily. 4 Package 4  . levofloxacin (LEVAQUIN) 500 MG tablet Take 1 tablet (500 mg total) by mouth daily. (Patient not taking: Reported on 02/01/2017) 10 tablet 0   No current facility-administered medications on file prior to visit.     Social History  Substance Use Topics  . Smoking status: Never Smoker  . Smokeless tobacco: Never Used  . Alcohol use 0.0 oz/week     Comment: occasionally    Review of Systems    Constitutional: Negative for chills and fever.  HENT: Negative for congestion, sinus pain and sore throat.   Respiratory: Positive for cough. Negative for shortness of breath and wheezing.   Cardiovascular: Negative for chest pain and palpitations.  Gastrointestinal: Negative for nausea and vomiting.  Psychiatric/Behavioral: Negative for sleep disturbance. The patient is not nervous/anxious.       Objective:    BP (!) 118/92 (BP Location: Left Arm, Patient Position: Sitting, Cuff Size: Normal)   Pulse 88   Temp 99.3 F (37.4 C) (Oral)   Resp 16   Wt 144 lb 4 oz (65.4 kg)   SpO2 98%   BMI 26.38 kg/m  BP Readings from Last 3 Encounters:  02/01/17 (!) 118/92  12/14/16 120/80  12/05/16 110/79   Wt Readings from Last 3 Encounters:  02/01/17 144 lb 4 oz (65.4 kg)  08/24/16 139 lb (63 kg)  07/18/16 139 lb 3.2 oz (63.1 kg)    Physical Exam  Constitutional: She appears well-developed and well-nourished.  HENT:  Head: Normocephalic and atraumatic.  Right Ear: Hearing, tympanic membrane, external ear and ear canal normal. No drainage, swelling or tenderness. No foreign bodies. Tympanic membrane is not erythematous and not bulging. No middle ear effusion. No decreased hearing is noted.  Left Ear: Hearing, tympanic membrane, external ear and ear canal normal. No drainage, swelling or tenderness.  No foreign bodies. Tympanic membrane is not erythematous and not bulging.  No middle ear effusion. No decreased hearing is noted.  Nose: Nose normal. No rhinorrhea. Right sinus exhibits no maxillary sinus tenderness and no frontal sinus tenderness. Left sinus exhibits no maxillary sinus tenderness and no frontal sinus tenderness.  Mouth/Throat: Uvula is midline, oropharynx is clear and moist and mucous membranes are normal. No oropharyngeal exudate, posterior oropharyngeal edema, posterior oropharyngeal erythema or tonsillar abscesses.  Eyes: Conjunctivae are normal.  Cardiovascular: Normal rate,  regular rhythm, normal heart sounds and normal pulses.   Pulmonary/Chest: Effort normal and breath sounds normal. She has no wheezes. She has no rhonchi. She has no rales.  Lymphadenopathy:       Head (right side): No submental, no submandibular, no tonsillar, no preauricular, no posterior auricular and no occipital adenopathy present.       Head (left side): No submental, no submandibular, no tonsillar, no preauricular, no posterior auricular and no occipital adenopathy present.    She has no cervical adenopathy.  Neurological: She is alert.  Skin: Skin is warm and dry.  Psychiatric: She has a normal mood and affect. Her speech is normal and behavior is normal. Thought content normal.  Vitals reviewed.      Assessment & Plan:   Problem List Items Addressed This Visit      Cardiovascular and Mediastinum   Benign essential HTN    At goal. Continue current regimen. Pending CMP.      Relevant Orders   Comprehensive metabolic panel   CBC with Differential/Platelet   Hemoglobin A1c   TSH   VITAMIN D 25 Hydroxy (Vit-D Deficiency, Fractures)     Respiratory   Bronchitis    Clinically improving. Slight elevation in temperature today. GIven prn cough medication. Will let me know if doesn't continue to improve. Suspect playing a role in fatigue and advised should be self limiting.      Relevant Medications   chlorpheniramine-HYDROcodone (TUSSIONEX PENNKINETIC ER) 10-8 MG/5ML SUER   chlorpheniramine-HYDROcodone (TUSSIONEX PENNKINETIC ER) 10-8 MG/5ML SUER     Other   GAD (generalized anxiety disorder) - Primary    Trial of increasing Wellbutrin and decreasing Zoloft. Follow-up 3-6 months      Relevant Medications   sertraline (ZOLOFT) 50 MG tablet   buPROPion (WELLBUTRIN XL) 300 MG 24 hr tablet   HLD (hyperlipidemia)    total cholesterol 285, HDL 65 at work last week. Discussed lifestyle modifications. Calculated CVD risk is 1.9%. We jointly agreed would repeat the labs in 6  months. Otherwise today we will do screening labs as CPE is done with CNM ( mammogram, breast exam, pap).           I have discontinued Ms. Dillon's hydrOXYzine. I have also changed her sertraline and buPROPion. Additionally, I am having her maintain her ALPRAZolam, losartan-hydrochlorothiazide, norethindrone-ethinyl estradiol, meloxicam, levofloxacin, chlorpheniramine-HYDROcodone, and chlorpheniramine-HYDROcodone.   Meds ordered this encounter  Medications  . DISCONTD: chlorpheniramine-HYDROcodone (TUSSIONEX PENNKINETIC ER) 10-8 MG/5ML SUER    Sig: Take 5 mLs by mouth every 12 (twelve) hours as needed for cough.    Dispense:  150 mL    Refill:  0    rx written    Order Specific Question:   Supervising Provider    Answer:   Duncan Dull L [[id number]]  . sertraline (ZOLOFT) 50 MG tablet    Sig: Take 1 tablet (50 mg total) by mouth daily.    Dispense:  90 tablet    Refill:  1    Order Specific Question:   Supervising Provider    Answer:   Duncan Dull L [[id number]]  . buPROPion (WELLBUTRIN XL) 300 MG 24 hr tablet    Sig: Take 1 tablet (300 mg total) by mouth every morning.    Dispense:  90 tablet    Refill:  1    Order Specific Question:   Supervising Provider    Answer:   Duncan Dull L [[id number]]  . DISCONTD: chlorpheniramine-HYDROcodone (TUSSIONEX PENNKINETIC ER) 10-8 MG/5ML SUER    Sig: Take 5 mLs by mouth every 12 (twelve) hours as needed for cough.    Dispense:  150 mL    Refill:  0    rx written    Order Specific Question:   Supervising Provider    Answer:   Duncan Dull L [[id number]]  . chlorpheniramine-HYDROcodone (TUSSIONEX PENNKINETIC ER) 10-8 MG/5ML SUER    Sig: Take 5 mLs by mouth every 12 (twelve) hours as needed for cough.    Dispense:  150 mL    Refill:  0    rx written    Order Specific Question:   Supervising Provider    Answer:   Duncan Dull L [[id number]]  . chlorpheniramine-HYDROcodone (TUSSIONEX PENNKINETIC ER) 10-8 MG/5ML SUER    Sig: Take 5 mLs by mouth every 12  (twelve) hours as needed for cough.    Dispense:  150 mL    Refill:  0    rx written    Order Specific Question:   Supervising Provider    Answer:   Sherlene Shams [[id number]]    Return precautions given.   Risks, benefits, and alternatives of the medications and treatment plan prescribed today were discussed, and patient expressed understanding.   Education regarding symptom management and diagnosis given to patient on AVS.  Continue to follow with Rennie Plowman, FNP for routine health maintenance.   Meghan Mans and I agreed with plan.   Rennie Plowman, FNP

## 2017-02-01 NOTE — Assessment & Plan Note (Signed)
Trial of increasing Wellbutrin and decreasing Zoloft. Follow-up 3-6 months

## 2017-02-01 NOTE — Addendum Note (Signed)
Addended by: Penne LashWIGGINS, Zarrah Loveland N on: 02/01/2017 10:59 AM   Modules accepted: Orders

## 2017-02-01 NOTE — Assessment & Plan Note (Addendum)
total cholesterol 285, HDL 65 at work last week. Discussed lifestyle modifications. Calculated CVD risk is 1.9%. We jointly agreed would repeat the labs in 6 months. Otherwise today we will do screening labs as CPE is done with CNM ( mammogram, breast exam, pap).

## 2017-02-01 NOTE — Assessment & Plan Note (Signed)
At goal. Continue current regimen. Pending CMP.  

## 2017-02-01 NOTE — Assessment & Plan Note (Addendum)
Clinically improving. Slight elevation in temperature today. GIven prn cough medication. Will let me know if doesn't continue to improve. Suspect playing a role in fatigue and advised should be self limiting.

## 2017-02-01 NOTE — Progress Notes (Signed)
Pre visit review using our clinic review tool, if applicable. No additional management support is needed unless otherwise documented below in the visit note. 

## 2017-02-02 ENCOUNTER — Other Ambulatory Visit: Payer: Self-pay

## 2017-02-05 ENCOUNTER — Other Ambulatory Visit (INDEPENDENT_AMBULATORY_CARE_PROVIDER_SITE_OTHER): Payer: Managed Care, Other (non HMO)

## 2017-02-05 DIAGNOSIS — I10 Essential (primary) hypertension: Secondary | ICD-10-CM

## 2017-02-05 NOTE — Addendum Note (Signed)
Addended by: Warden FillersWRIGHT, Timia Casselman S on: 02/05/2017 03:26 PM   Modules accepted: Orders

## 2017-02-06 ENCOUNTER — Telehealth: Payer: Self-pay | Admitting: Radiology

## 2017-02-06 LAB — COMPREHENSIVE METABOLIC PANEL
ALBUMIN: 4.3 g/dL (ref 3.5–5.2)
ALT: 16 U/L (ref 0–35)
AST: 17 U/L (ref 0–37)
Alkaline Phosphatase: 63 U/L (ref 39–117)
BILIRUBIN TOTAL: 0.2 mg/dL (ref 0.2–1.2)
BUN: 19 mg/dL (ref 6–23)
CALCIUM: 10 mg/dL (ref 8.4–10.5)
CO2: 27 meq/L (ref 19–32)
CREATININE: 0.89 mg/dL (ref 0.40–1.20)
Chloride: 98 mEq/L (ref 96–112)
GFR: 71.23 mL/min (ref >60.00)
Glucose, Bld: 95 mg/dL (ref 70–99)
Potassium: 3.8 mEq/L (ref 3.5–5.1)
Sodium: 134 mEq/L — ABNORMAL LOW (ref 135–145)
Total Protein: 8.3 g/dL (ref 6.0–8.3)

## 2017-02-06 LAB — VITAMIN D 25 HYDROXY (VIT D DEFICIENCY, FRACTURES): VITD: 50.76 ng/mL (ref 30.00–100.00)

## 2017-02-06 LAB — TSH: TSH: 2.62 u[IU]/mL (ref 0.35–4.50)

## 2017-02-06 NOTE — Addendum Note (Signed)
Addended by: Penne LashWIGGINS, Rachard Isidro N on: 02/06/2017 10:55 AM   Modules accepted: Orders

## 2017-02-06 NOTE — Telephone Encounter (Signed)
Gaetano NetFYI, Karen from [facility] called and stated there was not enough blood for CBC to be ran.

## 2017-02-22 ENCOUNTER — Other Ambulatory Visit: Payer: Self-pay

## 2017-02-22 ENCOUNTER — Encounter: Payer: Self-pay | Admitting: Family

## 2017-02-22 MED ORDER — LOSARTAN POTASSIUM-HCTZ 50-12.5 MG PO TABS: 1.0000 | ORAL_TABLET | Freq: Every day | ORAL | 2 refills | Status: DC

## 2017-03-20 ENCOUNTER — Other Ambulatory Visit: Payer: Self-pay | Admitting: Family

## 2017-03-30 ENCOUNTER — Other Ambulatory Visit: Payer: Self-pay

## 2017-03-30 DIAGNOSIS — F411 Generalized anxiety disorder: Secondary | ICD-10-CM

## 2017-03-30 MED ORDER — SERTRALINE HCL 50 MG PO TABS: 50.0000 mg | ORAL_TABLET | Freq: Every day | ORAL | 1 refills | Status: DC

## 2017-04-03 ENCOUNTER — Other Ambulatory Visit: Payer: Self-pay

## 2017-04-03 ENCOUNTER — Telehealth: Payer: Self-pay

## 2017-04-03 ENCOUNTER — Telehealth: Payer: Self-pay | Admitting: Gastroenterology

## 2017-04-03 DIAGNOSIS — Z1211 Encounter for screening for malignant neoplasm of colon: Secondary | ICD-10-CM

## 2017-04-03 NOTE — Telephone Encounter (Signed)
Gastroenterology Pre-Procedure Review  Request Date: 04/27/17 Requesting Physician: Dr. Servando SnareWohl  PATIENT REVIEW QUESTIONS: The patient responded to the following health history questions as indicated:    1. Are you having any GI issues? yes (colonoscopy) 2. Do you have a personal history of Polyps? no 3. Do you have a family history of Colon Cancer or Polyps? no 4. Diabetes Mellitus? no 5. Joint replacements in the past 12 months?no 6. Major health problems in the past 3 months?no 7. Any artificial heart valves, MVP, or defibrillator?no    MEDICATIONS & ALLERGIES:    Patient reports the following regarding taking any anticoagulation/antiplatelet therapy:   Plavix, Coumadin, Eliquis, Xarelto, Lovenox, Pradaxa, Brilinta, or Effient? no Aspirin? yes (81mg )  Patient confirms/reports the following medications:  Current Outpatient Prescriptions  Medication Sig Dispense Refill  . etodolac (LODINE) 500 MG tablet Take by mouth.    . ALPRAZolam (XANAX) 0.5 MG tablet Take 1 tablet (0.5 mg total) by mouth at bedtime as needed for anxiety. 60 tablet 1  . buPROPion (WELLBUTRIN XL) 150 MG 24 hr tablet Take 150 mg by mouth daily.  1  . buPROPion (WELLBUTRIN XL) 300 MG 24 hr tablet Take 1 tablet (300 mg total) by mouth every morning. 90 tablet 1  . chlorpheniramine-HYDROcodone (TUSSIONEX PENNKINETIC ER) 10-8 MG/5ML SUER Take 5 mLs by mouth every 12 (twelve) hours as needed for cough. 150 mL 0  . chlorpheniramine-HYDROcodone (TUSSIONEX PENNKINETIC ER) 10-8 MG/5ML SUER Take 5 mLs by mouth every 12 (twelve) hours as needed for cough. 150 mL 0  . levofloxacin (LEVAQUIN) 500 MG tablet Take 1 tablet (500 mg total) by mouth daily. (Patient not taking: Reported on 02/01/2017) 10 tablet 0  . losartan-hydrochlorothiazide (HYZAAR) 50-12.5 MG tablet Take 1 tablet by mouth daily. 90 tablet 2  . meloxicam (MOBIC) 15 MG tablet Take 1 tablet (15 mg total) by mouth daily. 30 tablet 5  . norethindrone-ethinyl estradiol  (MICROGESTIN,JUNEL,LOESTRIN) 1-20 MG-MCG tablet Take 1 tablet by mouth daily. 4 Package 4  . sertraline (ZOLOFT) 100 MG tablet Take 100 mg by mouth daily.  3  . sertraline (ZOLOFT) 50 MG tablet Take 1 tablet (50 mg total) by mouth daily. 90 tablet 1   No current facility-administered medications for this visit.     Patient confirms/reports the following allergies:  No Known Allergies  No orders of the defined types were placed in this encounter.   AUTHORIZATION INFORMATION Primary Insurance: 1D#: Group #:  Secondary Insurance: 1D#: Group #:  SCHEDULE INFORMATION: Date: 04/27/17 Time: Location: [facility]

## 2017-04-03 NOTE — Telephone Encounter (Signed)
04/03/17 Faxed Prior Auth form to [employer]. 

## 2017-04-23 ENCOUNTER — Encounter: Payer: Self-pay | Admitting: *Deleted

## 2017-04-26 NOTE — Discharge Instructions (Signed)
General Anesthesia, Adult, Care After °These instructions provide you with information about caring for yourself after your procedure. Your health care provider may also give you more specific instructions. Your treatment has been planned according to current medical practices, but problems sometimes occur. Call your health care provider if you have any problems or questions after your procedure. °What can I expect after the procedure? °After the procedure, it is common to have: °· Vomiting. °· A sore throat. °· Mental slowness. ° °It is common to feel: °· Nauseous. °· Cold or shivery. °· Sleepy. °· Tired. °· Sore or achy, even in parts of your body where you did not have surgery. ° °Follow these instructions at home: °For at least 24 hours after the procedure: °· Do not: °? Participate in activities where you could fall or become injured. °? Drive. °? Use heavy machinery. °? Drink alcohol. °? Take sleeping pills or medicines that cause drowsiness. °? Make important decisions or sign legal documents. °? Take care of children on your own. °· Rest. °Eating and drinking °· If you vomit, drink water, juice, or soup when you can drink without vomiting. °· Drink enough fluid to keep your urine clear or pale yellow. °· Make sure you have little or no nausea before eating solid foods. °· Follow the diet recommended by your health care provider. °General instructions °· Have a responsible adult stay with you until you are awake and alert. °· Return to your normal activities as told by your health care provider. Ask your health care provider what activities are safe for you. °· Take over-the-counter and prescription medicines only as told by your health care provider. °· If you smoke, do not smoke without supervision. °· Keep all follow-up visits as told by your health care provider. This is important. °Contact a health care provider if: °· You continue to have nausea or vomiting at home, and medicines are not helpful. °· You  cannot drink fluids or start eating again. °· You cannot urinate after 8-12 hours. °· You develop a skin rash. °· You have fever. °· You have increasing redness at the site of your procedure. °Get help right away if: °· You have difficulty breathing. °· You have chest pain. °· You have unexpected bleeding. °· You feel that you are having a life-threatening or urgent problem. °This information is not intended to replace advice given to you by your health care provider. Make sure you discuss any questions you have with your health care provider. °Document Released: 02/12/2001 Document Revised: 04/10/2016 Document Reviewed: 10/21/2015 °Elsevier Interactive Patient Education © 2018 Elsevier Inc. ° °

## 2017-04-27 ENCOUNTER — Ambulatory Visit
Admission: RE | Admit: 2017-04-27 | Discharge: 2017-04-27 | Disposition: A | Discharge status: Home / Self Care | Payer: Managed Care, Other (non HMO) | Source: Ambulatory Visit | Attending: Gastroenterology | Admitting: Gastroenterology

## 2017-04-27 ENCOUNTER — Ambulatory Visit: Payer: Managed Care, Other (non HMO) | Admitting: Anesthesiology

## 2017-04-27 ENCOUNTER — Encounter
Admission: RE | Disposition: A | Discharge status: Home / Self Care | Payer: Self-pay | Source: Ambulatory Visit | Attending: Gastroenterology

## 2017-04-27 DIAGNOSIS — Z79899 Other long term (current) drug therapy: Secondary | ICD-10-CM | POA: Insufficient documentation

## 2017-04-27 DIAGNOSIS — Z7982 Long term (current) use of aspirin: Secondary | ICD-10-CM | POA: Insufficient documentation

## 2017-04-27 DIAGNOSIS — I10 Essential (primary) hypertension: Secondary | ICD-10-CM | POA: Diagnosis not present

## 2017-04-27 DIAGNOSIS — Z791 Long term (current) use of non-steroidal anti-inflammatories (NSAID): Secondary | ICD-10-CM | POA: Insufficient documentation

## 2017-04-27 DIAGNOSIS — E7800 Pure hypercholesterolemia, unspecified: Secondary | ICD-10-CM | POA: Insufficient documentation

## 2017-04-27 DIAGNOSIS — F419 Anxiety disorder, unspecified: Secondary | ICD-10-CM | POA: Insufficient documentation

## 2017-04-27 DIAGNOSIS — Z1211 Encounter for screening for malignant neoplasm of colon: Secondary | ICD-10-CM | POA: Diagnosis present

## 2017-04-27 DIAGNOSIS — Z793 Long term (current) use of hormonal contraceptives: Secondary | ICD-10-CM | POA: Insufficient documentation

## 2017-04-27 HISTORY — DX: Carpal tunnel syndrome, right upper limb: G56.01

## 2017-04-27 HISTORY — PX: COLONOSCOPY WITH PROPOFOL: SHX5780

## 2017-04-27 SURGERY — COLONOSCOPY WITH PROPOFOL
Anesthesia: Monitor Anesthesia Care | Wound class: Contaminated

## 2017-04-27 MED ORDER — PROPOFOL 10 MG/ML IV BOLUS
INTRAVENOUS | Status: DC | PRN
  Administered 2017-04-27: 60 mg via INTRAVENOUS
  Administered 2017-04-27: 20 mg via INTRAVENOUS
  Administered 2017-04-27: 60 mg via INTRAVENOUS
  Administered 2017-04-27: 20 mg via INTRAVENOUS
  Administered 2017-04-27: 60 mg via INTRAVENOUS
  Administered 2017-04-27 (×3): 20 mg via INTRAVENOUS

## 2017-04-27 MED ORDER — LIDOCAINE HCL (CARDIAC) 20 MG/ML IV SOLN
INTRAVENOUS | Status: DC | PRN
Start: 2017-04-27 — End: 2017-04-27
  Administered 2017-04-27: 40 mg via INTRAVENOUS

## 2017-04-27 MED ORDER — STERILE WATER FOR IRRIGATION IR SOLN
Status: DC | PRN
  Administered 2017-04-27: 09:00:00

## 2017-04-27 MED ORDER — LACTATED RINGERS IV SOLN
INTRAVENOUS | Status: DC | PRN
  Administered 2017-04-27: 09:00:00 via INTRAVENOUS

## 2017-04-27 SURGICAL SUPPLY — 23 items

## 2017-04-27 NOTE — H&P (Signed)
Midge Minium, MD [facility] [address], [address] [place], [place] [address] Phone: [phone] Fax : [phone]  Primary Care Physician:  Allegra Grana, FNP Primary Gastroenterologist:  Dr. Servando Snare  Pre-Procedure History & Physical: HPI:  Meghan Sandoval is a 51 y.o. female is here for a screening colonoscopy.   Past Medical History:  Diagnosis Date  . Anxiety   . Carpal tunnel syndrome of right wrist   . Hypercholesteremia   . Hypertension     Past Surgical History:  Procedure Laterality Date  . no past surgery    . TONSILLECTOMY     age 44    Prior to Admission medications   Medication Sig Start Date End Date Taking? Authorizing Provider  ALPRAZolam Prudy Feeler) 0.5 MG tablet Take 1 tablet (0.5 mg total) by mouth at bedtime as needed for anxiety. 06/28/16  Yes Shambley, Melody N, CNM  aspirin 81 MG tablet Take 81 mg by mouth daily.   Yes [provider]  buPROPion (WELLBUTRIN XL) 300 MG 24 hr tablet Take 1 tablet (300 mg total) by mouth every morning. 02/01/17 04/23/17 Yes Arnett, Lyn Records, FNP  losartan-hydrochlorothiazide (HYZAAR) 50-12.5 MG tablet Take 1 tablet by mouth daily. 02/22/17  Yes Allegra Grana, FNP  meloxicam (MOBIC) 15 MG tablet Take 1 tablet (15 mg total) by mouth daily. 08/21/16  Yes Fisher, Roselyn Bering, PA-C  norethindrone-ethinyl estradiol (MICROGESTIN,JUNEL,LOESTRIN) 1-20 MG-MCG tablet Take 1 tablet by mouth daily. 06/28/16  Yes Shambley, Melody N, CNM  sertraline (ZOLOFT) 50 MG tablet Take 1 tablet (50 mg total) by mouth daily. 03/30/17  Yes Allegra Grana, FNP    Allergies as of 04/03/2017  . (No Known Allergies)    Family History  Problem Relation Age of Onset  . Other Mother        gastrointestinal  . Breast cancer Neg Hx     Social History   Social History  . Marital status: Divorced    Spouse name: N/A  . Number of children: 2  . Years of education: Tech Collg   Occupational History  . [employer]   Social History  Main Topics  . Smoking status: Never Smoker  . Smokeless tobacco: Never Used  . Alcohol use 1.2 oz/week    2 Glasses of wine per week     Comment: occasionally  . Drug use: No  . Sexual activity: Yes    Partners: Male    Birth control/ protection: Post-menopausal   Other Topics Concern  . Not on file   Social History Narrative   Patient lives at home with fiance.    Caffeine Use: 2-3 cups daily   Employed as a Psychologist, sport and exercise    Some college    2 children (1 daughter, taylor, works as a Clinical biochemist at [employer])        Review of Systems: See HPI, otherwise negative ROS  Physical Exam: BP (!) 153/84   Pulse 78   Temp 97.9 F (36.6 C) (Temporal)   Ht 5\' 2"  (1.575 m)   Wt 138 lb (62.6 kg)   SpO2 100%   BMI 25.24 kg/m  General:   Alert,  pleasant and cooperative in NAD Head:  Normocephalic and atraumatic. Neck:  Supple; no masses or thyromegaly. Lungs:  Clear throughout to auscultation.    Heart:  Regular rate and rhythm. Abdomen:  Soft, nontender and nondistended. Normal bowel sounds, without guarding, and without rebound.   Neurologic:  Alert and  oriented  x4;  grossly normal neurologically.  Impression/Plan: Meghan Sandoval is now here to undergo a screening colonoscopy.  Risks, benefits, and alternatives regarding colonoscopy have been reviewed with the patient.  Questions have been answered.  All parties agreeable.

## 2017-04-27 NOTE — Anesthesia Procedure Notes (Signed)
Procedure Name: MAC Performed by: Garrett Bowring Pre-anesthesia Checklist: Patient identified, Emergency Drugs available, Suction available, Patient being monitored and Timeout performed Patient Re-evaluated:Patient Re-evaluated prior to inductionOxygen Delivery Method: Nasal cannula       

## 2017-04-27 NOTE — Transfer of Care (Signed)
Immediate Anesthesia Transfer of Care Note  Patient: Meghan Sandoval  Procedure(s) Performed: Procedure(s): COLONOSCOPY WITH PROPOFOL (N/A)  Patient Location: PACU  Anesthesia Type: MAC  Level of Consciousness: awake, alert  and patient cooperative  Airway and Oxygen Therapy: Patient Spontanous Breathing and Patient connected to supplemental oxygen  Post-op Assessment: Post-op Vital signs reviewed, Patient's Cardiovascular Status Stable, Respiratory Function Stable, Patent Airway and No signs of Nausea or vomiting  Post-op Vital Signs: Reviewed and stable  Complications: No apparent anesthesia complications

## 2017-04-27 NOTE — Op Note (Signed)
[facility] Gastroenterology Patient Name: Meghan Sandoval Procedure Date: 04/27/2017 8:56 AM MRN: [id number] Account #: [id number] Date of Birth: [birth year] Admit Type: Outpatient Age: 51 [facility] Gender: Female Note Status: Finalized Procedure:            Colonoscopy Indications:          Screening for colorectal malignant neoplasm Providers:            Midge Miniumarren Eeshan Verbrugge MD, MD Referring MD:         Lyn RecordsMargaret G. Arnett (Referring MD) Medicines:            Propofol per Anesthesia Complications:        No immediate complications. Procedure:            Pre-Anesthesia Assessment:                       - Prior to the procedure, a History and Physical was                        performed, and patient medications and allergies were                        reviewed. The patient's tolerance of previous                        anesthesia was also reviewed. The risks and benefits of                        the procedure and the sedation options and risks were                        discussed with the patient. All questions were                        answered, and informed consent was obtained. Prior                        Anticoagulants: The patient has taken no previous                        anticoagulant or antiplatelet agents. ASA Grade                        Assessment: II - A patient with mild systemic disease.                        After reviewing the risks and benefits, the patient was                        deemed in satisfactory condition to undergo the                        procedure.                       After obtaining informed consent, the colonoscope was                        passed under direct vision. Throughout the procedure,  the patient's blood pressure, pulse, and oxygen                        saturations were monitored continuously. The Olympus CF                        H180AL Colonoscope (S#: [id number]) was introduced through                         the anus and advanced to the the cecum, identified by                        appendiceal orifice and ileocecal valve. The                        colonoscopy was performed without difficulty. The                        patient tolerated the procedure well. The quality of                        the bowel preparation was excellent. Findings:      The perianal and digital rectal examinations were normal.      The colon (entire examined portion) appeared normal. Impression:           - The entire examined colon is normal.                       - No specimens collected. Recommendation:       - Discharge patient to home.                       - Resume previous diet.                       - Continue present medications.                       - Repeat colonoscopy in 10 years for screening unless                        any change in family history or lower GI problems. Procedure Code(s):    --- Professional ---                       [id number], Colonoscopy, flexible; diagnostic, including                        collection of specimen(s) by brushing or washing, when                        performed (separate procedure) Diagnosis Code(s):    --- Professional ---                       Z12.11, Encounter for screening for malignant neoplasm                        of colon CPT copyright 2016 American Medical Association. All rights reserved. The codes documented in this report are preliminary and upon coder review may  be revised to meet current compliance requirements. [name]  Charmane Protzman MD, MD 04/27/2017 9:22:41 AM This report has been signed electronically. Number of Addenda: 0 Note Initiated On: 04/27/2017 8:56 AM Scope Withdrawal Time: 0 hours 5 minutes 37 seconds  Total Procedure Duration: 0 hours 11 minutes 15 seconds       [facility]

## 2017-04-27 NOTE — Anesthesia Preprocedure Evaluation (Signed)
Anesthesia Evaluation  Patient identified by MRN, date of birth, ID band Patient awake    Airway Mallampati: II  TM Distance: >3 FB Neck ROM: Full    Dental   Pulmonary    Pulmonary exam normal        Cardiovascular hypertension, Pt. on medications Normal cardiovascular exam     Neuro/Psych  Headaches, Anxiety    GI/Hepatic   Endo/Other    Renal/GU      Musculoskeletal   Abdominal   Peds  Hematology   Anesthesia Other Findings   Reproductive/Obstetrics                             Anesthesia Physical Anesthesia Plan  ASA: II  Anesthesia Plan: MAC   Post-op Pain Management:    Induction: Intravenous  PONV Risk Score and Plan:   Airway Management Planned:   Additional Equipment:   Intra-op Plan:   Post-operative Plan:   Informed Consent: I have reviewed the patients History and Physical, chart, labs and discussed the procedure including the risks, benefits and alternatives for the proposed anesthesia with the patient or authorized representative who has indicated his/her understanding and acceptance.     Plan Discussed with: CRNA  Anesthesia Plan Comments:         Anesthesia Quick Evaluation

## 2017-04-27 NOTE — Anesthesia Postprocedure Evaluation (Signed)
Anesthesia Post Note  Patient: Meghan Sandoval  Procedure(s) Performed: Procedure(s) (LRB): COLONOSCOPY WITH PROPOFOL (N/A)  Patient location during evaluation: PACU Anesthesia Type: MAC Level of consciousness: awake and alert Pain management: pain level controlled Vital Signs Assessment: post-procedure vital signs reviewed and stable Respiratory status: spontaneous breathing, nonlabored ventilation, respiratory function stable and patient connected to nasal cannula oxygen Cardiovascular status: stable and blood pressure returned to baseline Anesthetic complications: no    Marshell Levan

## 2017-05-01 ENCOUNTER — Other Ambulatory Visit: Payer: Self-pay | Admitting: Physician Assistant

## 2017-05-01 ENCOUNTER — Other Ambulatory Visit: Payer: Self-pay | Admitting: Family

## 2017-05-01 MED ORDER — MELOXICAM 15 MG PO TABS: 15.0000 mg | ORAL_TABLET | Freq: Every day | ORAL | 5 refills | Status: DC

## 2017-05-04 ENCOUNTER — Encounter: Payer: Self-pay | Admitting: Gastroenterology

## 2017-07-05 ENCOUNTER — Ambulatory Visit (INDEPENDENT_AMBULATORY_CARE_PROVIDER_SITE_OTHER): Payer: Managed Care, Other (non HMO) | Admitting: Obstetrics and Gynecology

## 2017-07-05 ENCOUNTER — Encounter: Payer: Self-pay | Admitting: Obstetrics and Gynecology

## 2017-07-05 ENCOUNTER — Other Ambulatory Visit: Payer: Self-pay | Admitting: Obstetrics and Gynecology

## 2017-07-05 VITALS — BP 130/84 | HR 85 | Ht 62.0 in | Wt 142.8 lb

## 2017-07-05 DIAGNOSIS — Z01419 Encounter for gynecological examination (general) (routine) without abnormal findings: Secondary | ICD-10-CM

## 2017-07-05 DIAGNOSIS — F411 Generalized anxiety disorder: Secondary | ICD-10-CM

## 2017-07-05 MED ORDER — NORETHINDRONE ACET-ETHINYL EST 1-20 MG-MCG PO TABS: 1.0000 | ORAL_TABLET | Freq: Every day | ORAL | 4 refills | Status: DC

## 2017-07-05 MED ORDER — ALPRAZOLAM 0.5 MG PO TABS: 0.5000 mg | ORAL_TABLET | Freq: Every evening | ORAL | 4 refills | Status: DC | PRN

## 2017-07-05 MED ORDER — BUPROPION HCL ER (XL) 300 MG PO TB24: 300.0000 mg | ORAL_TABLET | ORAL | 4 refills | Status: DC

## 2017-07-05 MED ORDER — MELOXICAM 15 MG PO TABS: 15.0000 mg | ORAL_TABLET | Freq: Every day | ORAL | 5 refills | Status: DC

## 2017-07-05 MED ORDER — LOSARTAN POTASSIUM-HCTZ 50-12.5 MG PO TABS: 1.0000 | ORAL_TABLET | Freq: Every day | ORAL | 4 refills | Status: DC

## 2017-07-05 MED ORDER — SERTRALINE HCL 50 MG PO TABS: 50.0000 mg | ORAL_TABLET | Freq: Every day | ORAL | 4 refills | Status: DC

## 2017-07-05 NOTE — Progress Notes (Signed)
Subjective:   Meghan Sandoval is a 51 y.o. 562P0 Caucasian female here for a routine well-woman exam.  No LMP recorded. Patient is postmenopausal.    Current complaints: none PCP: Arnette       doesn't desire labs  Social History: Sexual: heterosexual Marital Status: married Living situation: with family Occupation: unknown occupation Tobacco/alcohol: no tobacco use Illicit drugs: no history of illicit drug use  The following portions of the patient's history were reviewed and updated as appropriate: allergies, current medications, past family history, past medical history, past social history, past surgical history and problem list.  Past Medical History Past Medical History:  Diagnosis Date  . Anxiety   . Carpal tunnel syndrome of right wrist   . Hypercholesteremia   . Hypertension     Past Surgical History Past Surgical History:  Procedure Laterality Date  . COLONOSCOPY WITH PROPOFOL N/A 04/27/2017   Procedure: COLONOSCOPY WITH PROPOFOL;  Surgeon: Midge MiniumWohl, Darren, MD;  [facility];  Service: Endoscopy;  Laterality: N/A;  . no past surgery    . TONSILLECTOMY     age 89    Gynecologic History G2P0  No LMP recorded. Patient is postmenopausal. Contraception: vasectomy Last Pap: 2015. Results were: normal Last mammogram: 2017. Results were: normal   Obstetric History OB History  Gravida Para Term Preterm AB Living  2         2  SAB TAB Ectopic Multiple Live Births          2    # Outcome Date GA Lbr Len/2nd Weight Sex Delivery Anes PTL Lv  2 Gravida 1996    F Vag-Spont   LIV  1 Gravida 1992    F Vag-Spont   LIV      Current Medications Current Outpatient Prescriptions on File Prior to Visit  Medication Sig Dispense Refill  . ALPRAZolam (XANAX) 0.5 MG tablet Take 1 tablet (0.5 mg total) by mouth at bedtime as needed for anxiety. 60 tablet 1  . losartan-hydrochlorothiazide (HYZAAR) 50-12.5 MG tablet Take 1 tablet by mouth daily. 90 tablet 2  .  meloxicam (MOBIC) 15 MG tablet Take 1 tablet (15 mg total) by mouth daily. 30 tablet 5  . norethindrone-ethinyl estradiol (MICROGESTIN,JUNEL,LOESTRIN) 1-20 MG-MCG tablet Take 1 tablet by mouth daily. 4 Package 4  . sertraline (ZOLOFT) 50 MG tablet Take 1 tablet (50 mg total) by mouth daily. 90 tablet 1  . aspirin 81 MG tablet Take 81 mg by mouth daily.    Marland Kitchen. buPROPion (WELLBUTRIN XL) 300 MG 24 hr tablet Take 1 tablet (300 mg total) by mouth every morning. 90 tablet 1   No current facility-administered medications on file prior to visit.     Review of Systems Patient denies any headaches, blurred vision, shortness of breath, chest pain, abdominal pain, problems with bowel movements, urination, or intercourse.  Objective:  BP 130/84   Pulse 85   Ht 5\' 2"  (1.575 m)   Wt 142 lb 12.8 oz (64.8 kg)   BMI 26.12 kg/m  Physical Exam  General:  Well developed, well nourished, no acute distress. She is alert and oriented x3. Skin:  Warm and dry Neck:  Midline trachea, no thyromegaly or nodules Cardiovascular: Regular rate and rhythm, no murmur heard Lungs:  Effort normal, all lung fields clear to auscultation bilaterally Breasts:  No dominant palpable mass, retraction, or nipple discharge Abdomen:  Soft, non tender, no hepatosplenomegaly or masses Pelvic:  External genitalia is normal in appearance.  The vagina  is normal in appearance. The cervix is bulbous, no CMT.  Thin prep pap is done with HR HPV cotesting. Uterus is felt to be normal size, shape, and contour.  No adnexal masses or tenderness noted. Extremities:  No swelling or varicosities noted Psych:  She has a normal mood and affect  Assessment:   Healthy well-woman exam HTN OCP user SSRI user  Plan:   F/U 1 year for Ae, or sooner if needed Mammogram ordered  Cheyla Duchemin Suzan Nailer, CNM

## 2017-07-06 LAB — CYTOLOGY - PAP

## 2017-07-11 ENCOUNTER — Encounter: Payer: Self-pay | Admitting: Obstetrics and Gynecology

## 2017-07-11 ENCOUNTER — Other Ambulatory Visit: Payer: Self-pay | Admitting: Obstetrics and Gynecology

## 2017-07-11 MED ORDER — FLIBANSERIN 100 MG PO TABS: 1.0000 | ORAL_TABLET | Freq: Every evening | ORAL | 6 refills | Status: DC

## 2017-07-17 ENCOUNTER — Other Ambulatory Visit: Payer: Self-pay | Admitting: Obstetrics and Gynecology

## 2017-07-18 NOTE — Telephone Encounter (Signed)
[facility] [place] [address] [place] [phone]- [phone] called and  Meghan Sandoval needs to enroll in the [facility] program before they can fill the medication   Please call [phone] to enroll and then call [facility] after enrollment for documentation and they can fill the medication

## 2017-10-22 ENCOUNTER — Ambulatory Visit
Admission: RE | Admit: 2017-10-22 | Discharge: 2017-10-22 | Disposition: A | Discharge status: Home / Self Care | Payer: Managed Care, Other (non HMO) | Source: Ambulatory Visit | Attending: Obstetrics and Gynecology | Admitting: Obstetrics and Gynecology

## 2017-10-22 DIAGNOSIS — Z01419 Encounter for gynecological examination (general) (routine) without abnormal findings: Secondary | ICD-10-CM

## 2017-10-22 DIAGNOSIS — Z1231 Encounter for screening mammogram for malignant neoplasm of breast: Secondary | ICD-10-CM | POA: Diagnosis present

## 2018-01-08 ENCOUNTER — Encounter: Payer: Self-pay | Admitting: Obstetrics and Gynecology

## 2018-01-11 ENCOUNTER — Other Ambulatory Visit: Payer: Self-pay | Admitting: Obstetrics and Gynecology

## 2018-03-13 ENCOUNTER — Ambulatory Visit: Payer: Managed Care, Other (non HMO) | Admitting: Family

## 2018-03-13 ENCOUNTER — Encounter

## 2018-03-13 ENCOUNTER — Encounter: Payer: Self-pay | Admitting: Family

## 2018-03-13 VITALS — BP 130/90 | HR 81 | Temp 98.8°F | Resp 16 | Wt 144.1 lb

## 2018-03-13 DIAGNOSIS — R1011 Right upper quadrant pain: Secondary | ICD-10-CM

## 2018-03-13 DIAGNOSIS — F411 Generalized anxiety disorder: Secondary | ICD-10-CM

## 2018-03-13 DIAGNOSIS — Z23 Encounter for immunization: Secondary | ICD-10-CM | POA: Diagnosis not present

## 2018-03-13 DIAGNOSIS — I10 Essential (primary) hypertension: Secondary | ICD-10-CM | POA: Diagnosis not present

## 2018-03-13 NOTE — Progress Notes (Signed)
Subjective:    Patient ID: Meghan Sandoval, female    DOB: 1966-04-05, 52 y.o.   MRN: [id number]  CC: RETA NORGREN is a 52 y.o. female who presents today for follow up.   HPI: RUQ pain intermittent pain,  For couple of years. Last episode 4 days ago, lasted an hour, stabbing pain, resolved with Gas X, ibuprofen, pepto bismal. Couple of weekends ago had a 'severe attack' which brought her in. Occurs around 8-9 pm. Dinner is around 5:30. Weight stable. No pulled muscle or injury.     No d, n,v , blood in stool, bloating, abdominal distention, belching, dyspepsia, trouble swallowing, hoarseness, sour taste in her mouth.  Takes mobic prn for carpal tunnel.  Takes ibuprofen every 3 days prn.  Rare alcohol.   Depression and anxiety- controlled. On wellbutrin. NO si/hi.     HISTORY:  Past Medical History:  Diagnosis Date  . Anxiety   . Carpal tunnel syndrome of right wrist   . Hypercholesteremia   . Hypertension    Past Surgical History:  Procedure Laterality Date  . COLONOSCOPY WITH PROPOFOL N/A 04/27/2017   Procedure: COLONOSCOPY WITH PROPOFOL;  Surgeon: Midge Minium, MD;  Location: [facility];  Service: Endoscopy;  Laterality: N/A;  . no past surgery    . TONSILLECTOMY     age 40   Family History  Problem Relation Age of Onset  . Other Mother        gastrointestinal  . Breast cancer Neg Hx     Allergies: Patient has no known allergies. Current Outpatient Medications on File Prior to Visit  Medication Sig Dispense Refill  . ALPRAZolam (XANAX) 0.5 MG tablet Take 1 tablet (0.5 mg total) by mouth at bedtime as needed for anxiety. 60 tablet 4  . aspirin 81 MG tablet Take 81 mg by mouth daily.    Marland Kitchen buPROPion (WELLBUTRIN XL) 300 MG 24 hr tablet Take 1 tablet (300 mg total) by mouth every morning. 90 tablet 4  . Flibanserin (ADDYI) 100 MG TABS Take 1 tablet by mouth Nightly. 30 tablet 6  . JUNEL 1/20 1-20 MG-MCG tablet TAKE 1 TABLET BY MOUTH DAILY. 84 tablet 3  .  losartan-hydrochlorothiazide (HYZAAR) 50-12.5 MG tablet Take 1 tablet by mouth daily. 90 tablet 4  . meloxicam (MOBIC) 15 MG tablet Take 1 tablet (15 mg total) by mouth daily. 30 tablet 5  . sertraline (ZOLOFT) 50 MG tablet Take 1 tablet (50 mg total) by mouth daily. 90 tablet 4   No current facility-administered medications on file prior to visit.     Social History   Tobacco Use  . Smoking status: Never Smoker  . Smokeless tobacco: Never Used  Substance Use Topics  . Alcohol use: Yes    Alcohol/week: 1.2 oz    Types: 2 Glasses of wine per week    Comment: occasionally  . Drug use: No    Review of Systems  Constitutional: Negative for chills and fever.  HENT: Negative for trouble swallowing and voice change.   Respiratory: Negative for cough.   Cardiovascular: Negative for chest pain and palpitations.  Gastrointestinal: Positive for abdominal pain. Negative for abdominal distention, blood in stool, constipation, diarrhea, nausea and vomiting.  Genitourinary: Negative for dysuria.      Objective:    BP 130/90 (BP Location: Left Arm, Patient Position: Sitting, Cuff Size: Normal)   Pulse 81   Temp 98.8 F (37.1 C) (Oral)   Resp 16   Wt 144  lb 2 oz (65.4 kg)   SpO2 98%   BMI 26.36 kg/m  BP Readings from Last 3 Encounters:  03/13/18 130/90  07/05/17 130/84  04/27/17 125/81   Wt Readings from Last 3 Encounters:  03/13/18 144 lb 2 oz (65.4 kg)  07/05/17 142 lb 12.8 oz (64.8 kg)  04/27/17 138 lb (62.6 kg)    Physical Exam  Constitutional: She appears well-developed and well-nourished.  Eyes: Conjunctivae are normal.  Cardiovascular: Normal rate, regular rhythm, normal heart sounds and normal pulses.  Pulmonary/Chest: Effort normal and breath sounds normal. She has no wheezes. She has no rhonchi. She has no rales.  Abdominal: Soft. Normal appearance and bowel sounds are normal. She exhibits no distension, no fluid wave, no ascites and no mass. There is no tenderness.  There is no rigidity, no rebound, no guarding and no CVA tenderness.  Neurological: She is alert.  Skin: Skin is warm and dry.  Psychiatric: She has a normal mood and affect. Her speech is normal and behavior is normal. Thought content normal.  Vitals reviewed.      Assessment & Plan:   Problem List Items Addressed This Visit      Cardiovascular and Mediastinum   Benign essential HTN    Well-controlled on current regimen.  Will continue.        Other   GAD (generalized anxiety disorder)    Well-controlled on current regimen.  Will continue.      RUQ pain - Primary    Patient is well-appearing, nontoxic in appearance.  Etiology of pain nonspecific at this time.  Differentials include gallbladder etiology, peptic ulcer disease, GERD.  Pending labs, ultrasound, trial of PPI.  Advised her if she were to have any acute pain, or new symptoms, to let me know immediately or go the emergency room.  Will follow.      Relevant Orders   CBC with Differential/Platelet   Comprehensive metabolic panel   H. pylori breath test   US ABDOMEN LIMITED RUQ    Other Visit Diagnoses    Need for diphtheria-tetanus-pertussis (Tdap) vaccine       Relevant Orders   Tdap vaccine greater than or equal to 7yo IM (Completed)       I am having [name] D. Woodbeck maintain her aspirin, losartan-hydrochlorothiazide, ALPRAZolam, buPROPion, sertraline, meloxicam, Flibanserin, and JUNEL 1/20.   No orders of the defined types were placed in this encounter.   Return precautions given.   Risks, benefits, and alternatives of the medications and treatment plan prescribed today were discussed, and patient expressed understanding.   Education regarding symptom management and diagnosis given to patient on AVS.  Continue to follow with Allegra GranaArnett, Angelica Frandsen G, FNP for routine health maintenance.   Meghan Manserry D Biehn and I agreed with plan.   Rennie PlowmanMargaret August Longest, FNP

## 2018-03-13 NOTE — Assessment & Plan Note (Signed)
Well controlled on current regimen. Will continue.  

## 2018-03-13 NOTE — Assessment & Plan Note (Addendum)
Patient is well-appearing, nontoxic in appearance.  Etiology of pain nonspecific at this time.  Differentials include gallbladder etiology, peptic ulcer disease, GERD.  Pending labs, ultrasound, trial of PPI.  Advised her if she were to have any acute pain, or new symptoms, to let me know immediately or go the emergency room.  Will follow.

## 2018-03-13 NOTE — Patient Instructions (Addendum)
As discussed, suspect gallbladder may be playing a role.  Also considering peptic ulcer disease.  Pending labs, ultrasound.  If any acute symptoms or symptoms worsen altogether, please let me know as you may need to be seen in the emergency room.  n the interim, please start trial of Prilosec over-the-counter.    This medication is not for long-term use, trial for one  Month   Cholelithiasis Cholelithiasis is a form of gallbladder disease in which gallstones form in the gallbladder. The gallbladder is an organ that stores bile. Bile is made in the liver, and it helps to digest fats. Gallstones begin as small crystals and slowly grow into stones. They may cause no symptoms until the gallbladder tightens (contracts) and a gallstone is blocking the duct (gallbladder attack), which can cause pain. Cholelithiasis is also referred to as gallstones. There are two main types of gallstones:  Cholesterol stones. These are made of hardened cholesterol and are usually yellow-green in color. They are the most common type of gallstone. Cholesterol is a white, waxy, fat-like substance that is made in the liver.  Pigment stones. These are dark in color and are made of a red-yellow substance that forms when hemoglobin from red blood cells breaks down (bilirubin).  What are the causes? This condition may be caused by an imbalance in the substances that bile is made of. This can happen if the bile:  Has too much bilirubin.  Has too much cholesterol.  Does not have enough bile salts. These salts help the body absorb and digest fats.  In some cases, this condition can also be caused by the gallbladder not emptying completely or often enough. What increases the risk? The following factors may make you more likely to develop this condition:  Being female.  Having multiple pregnancies. Health care providers sometimes advise removing diseased gallbladders before future pregnancies.  Eating a diet that is  heavy in fried foods, fat, and refined carbohydrates, like white bread and white rice.  Being obese.  Being older than age 52.  Prolonged use of medicines that contain female hormones (estrogen).  Having diabetes mellitus.  Rapidly losing weight.  Having a family history of gallstones.  Being of American [place] or [place] descent.  Having an intestinal disease such as Crohn disease.  Having metabolic syndrome.  Having cirrhosis.  Having severe types of anemia such as sickle cell anemia.  What are the signs or symptoms? In most cases, there are no symptoms. These are known as silent gallstones. If a gallstone blocks the bile ducts, it can cause a gallbladder attack. The main symptom of a gallbladder attack is sudden pain in the upper right abdomen. The pain usually comes at night or after eating a large meal. The pain can last for one or several hours and can spread to the right shoulder or chest. If the bile duct is blocked for more than a few hours, it can cause infection or inflammation of the gallbladder, liver, or pancreas, which may cause:  Nausea.  Vomiting.  Abdominal pain that lasts for 5 hours or more.  Fever or chills.  Yellowing of the skin or the whites of the eyes (jaundice).  Dark urine.  Light-colored stools.  How is this diagnosed? This condition may be diagnosed based on:  A physical exam.  Your medical history.  An ultrasound of your gallbladder.  CT scan.  MRI.  Blood tests to check for signs of infection or inflammation.  A scan of your gallbladder and  bile ducts (biliary system) using nonharmful radioactive material and special cameras that can see the radioactive material (cholescintigram). This test checks to see how your gallbladder contracts and whether bile ducts are blocked.  Inserting a small tube with a camera on the end (endoscope) through your mouth to inspect bile ducts and check for blockages (endoscopic retrograde  cholangiopancreatogram).  How is this treated? Treatment for gallstones depends on the severity of the condition. Silent gallstones do not need treatment. If the gallstones cause a gallbladder attack or other symptoms, treatment may be required. Options for treatment include:  Surgery to remove the gallbladder (cholecystectomy). This is the most common treatment.  Medicines to dissolve gallstones. These are most effective at treating small gallstones. You may need to take medicines for up to 6-12 months.  Shock wave treatment (extracorporeal biliary lithotripsy). In this treatment, an ultrasound machine sends shock waves to the gallbladder to break gallstones into smaller pieces. These pieces can then be passed into the intestines or be dissolved by medicine. This is rarely used.  Removing gallstones through endoscopic retrograde cholangiopancreatogram. A small basket can be attached to the endoscope and used to capture and remove gallstones.  Follow these instructions at home:  Take over-the-counter and prescription medicines only as told by your health care provider.  Maintain a healthy weight and follow a healthy diet. This includes: ? Reducing fatty foods, such as fried food. ? Reducing refined carbohydrates, like white bread and white rice. ? Increasing fiber. Aim for foods like almonds, fruit, and beans.  Keep all follow-up visits as told by your health care provider. This is important. Contact a health care provider if:  You think you have had a gallbladder attack.  You have been diagnosed with silent gallstones and you develop abdominal pain or indigestion. Get help right away if:  You have pain from a gallbladder attack that lasts for more than 2 hours.  You have abdominal pain that lasts for more than 5 hours.  You have a fever or chills.  You have persistent nausea and vomiting.  You develop jaundice.  You have dark urine or light-colored  stools. Summary  Cholelithiasis (also called gallstones) is a form of gallbladder disease in which gallstones form in the gallbladder.  This condition is caused by an imbalance in the substances that make up bile. This can happen if the bile has too much cholesterol, too much bilirubin, or not enough bile salts.  You are more likely to develop this condition if you are female, pregnant, using medicines with estrogen, obese, older than age 12, or have a family history of gallstones. You may also develop gallstones if you have diabetes, an intestinal disease, cirrhosis, or metabolic syndrome.  Treatment for gallstones depends on the severity of the condition. Silent gallstones do not need treatment.  If gallstones cause a gallbladder attack or other symptoms, treatment may be needed. The most common treatment is surgery to remove the gallbladder. This information is not intended to replace advice given to you by your health care provider. Make sure you discuss any questions you have with your health care provider. Document Released: 11/02/2005 Document Revised: 07/23/2016 Document Reviewed: 07/23/2016 Elsevier Interactive Patient Education  2018 [employer].

## 2018-03-14 LAB — H. PYLORI BREATH TEST: H. PYLORI BREATH TEST: NOT DETECTED

## 2018-03-14 LAB — COMPREHENSIVE METABOLIC PANEL
ALBUMIN: 4.4 g/dL (ref 3.5–5.2)
ALK PHOS: 95 U/L (ref 39–117)
ALT: 51 U/L — ABNORMAL HIGH (ref 0–35)
AST: 33 U/L (ref 0–37)
BILIRUBIN TOTAL: 0.3 mg/dL (ref 0.2–1.2)
BUN: 17 mg/dL (ref 6–23)
CO2: 30 mEq/L (ref 19–32)
CREATININE: 0.95 mg/dL (ref 0.40–1.20)
Calcium: 9.8 mg/dL (ref 8.4–10.5)
Chloride: 98 mEq/L (ref 96–112)
GFR: 65.78 mL/min (ref >60.00)
Glucose, Bld: 89 mg/dL (ref 70–99)
Potassium: 3.7 mEq/L (ref 3.5–5.1)
SODIUM: 135 meq/L (ref 135–145)
TOTAL PROTEIN: 8.3 g/dL (ref 6.0–8.3)

## 2018-03-14 LAB — CBC WITH DIFFERENTIAL/PLATELET
Basophils Absolute: 0.1 10*3/uL (ref 0.0–0.1)
Basophils Relative: 1.4 % (ref 0.0–3.0)
EOS ABS: 0.1 10*3/uL (ref 0.0–0.7)
EOS PCT: 3.5 % (ref 0.0–5.0)
HCT: 41.2 % (ref 36.0–46.0)
HEMOGLOBIN: 13.9 g/dL (ref 12.0–15.0)
Lymphocytes Relative: 41.2 % (ref 12.0–46.0)
Lymphs Abs: 1.8 10*3/uL (ref 0.7–4.0)
MCHC: 33.6 g/dL (ref 30.0–36.0)
MCV: 91.4 fl (ref 78.0–100.0)
MONO ABS: 0.5 10*3/uL (ref 0.1–1.0)
Monocytes Relative: 12.7 % — ABNORMAL HIGH (ref 3.0–12.0)
Neutro Abs: 1.8 10*3/uL (ref 1.4–7.7)
Neutrophils Relative %: 41.2 % — ABNORMAL LOW (ref 43.0–77.0)
Platelets: 250 10*3/uL (ref 150.0–400.0)
RBC: 4.51 Mil/uL (ref 3.87–5.11)
RDW: 12.2 % (ref 11.5–15.5)
WBC: 4.3 10*3/uL (ref 4.0–10.5)

## 2018-03-19 ENCOUNTER — Ambulatory Visit
Admission: RE | Admit: 2018-03-19 | Discharge: 2018-03-19 | Disposition: A | Discharge status: Home / Self Care | Payer: Managed Care, Other (non HMO) | Source: Ambulatory Visit | Attending: Family | Admitting: Family

## 2018-03-19 DIAGNOSIS — R1011 Right upper quadrant pain: Secondary | ICD-10-CM | POA: Diagnosis present

## 2018-03-20 ENCOUNTER — Encounter: Payer: Self-pay | Admitting: Family

## 2018-05-27 ENCOUNTER — Other Ambulatory Visit: Payer: Self-pay | Admitting: *Deleted

## 2018-05-27 DIAGNOSIS — F411 Generalized anxiety disorder: Secondary | ICD-10-CM

## 2018-05-27 MED ORDER — BUPROPION HCL ER (XL) 300 MG PO TB24: 300.0000 mg | ORAL_TABLET | ORAL | 2 refills | Status: DC

## 2018-05-27 MED ORDER — SERTRALINE HCL 50 MG PO TABS: 50.0000 mg | ORAL_TABLET | Freq: Every day | ORAL | 2 refills | Status: DC

## 2018-06-12 ENCOUNTER — Ambulatory Visit: Payer: Self-pay | Admitting: Family

## 2018-07-11 ENCOUNTER — Encounter: Payer: Managed Care, Other (non HMO) | Admitting: Obstetrics and Gynecology

## 2018-07-17 ENCOUNTER — Encounter: Payer: Self-pay | Admitting: Obstetrics and Gynecology

## 2018-07-17 ENCOUNTER — Ambulatory Visit (INDEPENDENT_AMBULATORY_CARE_PROVIDER_SITE_OTHER): Payer: Managed Care, Other (non HMO) | Admitting: Obstetrics and Gynecology

## 2018-07-17 VITALS — BP 140/92 | HR 81 | Ht 62.0 in | Wt 144.3 lb

## 2018-07-17 DIAGNOSIS — Z01419 Encounter for gynecological examination (general) (routine) without abnormal findings: Secondary | ICD-10-CM

## 2018-07-17 NOTE — Progress Notes (Signed)
Subjective:   Meghan Sandoval is a 52 y.o. 32P0 Caucasian female here for a routine well-woman exam.  No LMP recorded. Patient is postmenopausal.    Current complaints: Stopped OCPs in February and only seen spotting for 3 days a few weeks.  PCP: Arnette       doesn't desire labs  Social History: Sexual: heterosexual Marital Status: married Living situation: with family Occupation: unknown occupation Tobacco/alcohol: no tobacco use Illicit drugs: no history of illicit drug use  The following portions of the patient's history were reviewed and updated as appropriate: allergies, current medications, past family history, past medical history, past social history, past surgical history and problem list.  Past Medical History Past Medical History:  Diagnosis Date  . Anxiety   . Carpal tunnel syndrome of right wrist   . Hypercholesteremia   . Hypertension     Past Surgical History Past Surgical History:  Procedure Laterality Date  . COLONOSCOPY WITH PROPOFOL N/A 04/27/2017   Procedure: COLONOSCOPY WITH PROPOFOL;  Surgeon: Midge MiniumWohl, Darren, MD;  [facility];  Service: Endoscopy;  Laterality: N/A;  . no past surgery    . TONSILLECTOMY     age 89    Gynecologic History G2P0  No LMP recorded. Patient is postmenopausal. Contraception: OCP (estrogen/progesterone) Last Pap: 2018. Results were: normal Last mammogram: 10/2017. Results were: normal   Obstetric History OB History  Gravida Para Term Preterm AB Living  2         2  SAB TAB Ectopic Multiple Live Births          2    # Outcome Date GA Lbr Len/2nd Weight Sex Delivery Anes PTL Lv  2 Gravida 1996    F Vag-Spont   LIV  1 Gravida 1992    F Vag-Spont   LIV    Current Medications Current Outpatient Medications on File Prior to Visit  Medication Sig Dispense Refill  . ALPRAZolam (XANAX) 0.5 MG tablet Take 1 tablet (0.5 mg total) by mouth at bedtime as needed for anxiety. 60 tablet 4  . buPROPion (WELLBUTRIN XL)  300 MG 24 hr tablet Take 300 mg by mouth daily.    Marland Kitchen. losartan-hydrochlorothiazide (HYZAAR) 50-12.5 MG tablet Take 1 tablet by mouth daily. 90 tablet 4  . sertraline (ZOLOFT) 50 MG tablet Take 1 tablet (50 mg total) by mouth daily. 90 tablet 2  . aspirin 81 MG tablet Take 81 mg by mouth daily.    Marland Kitchen. buPROPion (WELLBUTRIN XL) 300 MG 24 hr tablet Take 1 tablet (300 mg total) by mouth every morning. 90 tablet 2  . Flibanserin (ADDYI) 100 MG TABS Take 1 tablet by mouth Nightly. (Patient not taking: Reported on 07/17/2018) 30 tablet 6  . JUNEL 1/20 1-20 MG-MCG tablet TAKE 1 TABLET BY MOUTH DAILY. (Patient not taking: Reported on 07/17/2018) 84 tablet 3  . meloxicam (MOBIC) 15 MG tablet Take 1 tablet (15 mg total) by mouth daily. (Patient not taking: Reported on 07/17/2018) 30 tablet 5   No current facility-administered medications on file prior to visit.     Review of Systems Patient denies any headaches, blurred vision, shortness of breath, chest pain, abdominal pain, problems with bowel movements, urination, or intercourse.  Objective:  BP (!) 140/92   Pulse 81   Ht 5\' 2"  (1.575 m)   Wt 144 lb 4.8 oz (65.5 kg)   BMI 26.39 kg/m  Physical Exam  General:  Well developed, well nourished, no acute distress. She is alert  and oriented x3. Skin:  Warm and dry Neck:  Midline trachea, no thyromegaly or nodules Cardiovascular: Regular rate and rhythm, no murmur heard Lungs:  Effort normal, all lung fields clear to auscultation bilaterally Breasts:  No dominant palpable mass, retraction, or nipple discharge Abdomen:  Soft, non tender, no hepatosplenomegaly or masses Pelvic:  External genitalia is normal in appearance.  The vagina is normal in appearance. The cervix is bulbous, no CMT.  Thin prep pap is not done. Uterus is felt to be normal size, shape, and contour.  No adnexal masses or tenderness noted. Extremities:  No swelling or varicosities noted Psych:  She has a normal mood and  affect  Assessment:   Healthy well-woman exam HTN  Plan:  Recommend Miralax as needed.  F/U 1 year for AE, or sooner if needed Mammogram ordered   Melody Suzan Nailer, CNM

## 2018-09-10 ENCOUNTER — Other Ambulatory Visit: Payer: Self-pay | Admitting: Obstetrics and Gynecology

## 2018-09-10 MED ORDER — NORETHINDRONE ACET-ETHINYL EST 1-20 MG-MCG PO TABS: 1.0000 | ORAL_TABLET | Freq: Every day | ORAL | 3 refills | Status: DC

## 2018-11-14 ENCOUNTER — Ambulatory Visit
Admission: RE | Admit: 2018-11-14 | Discharge: 2018-11-14 | Disposition: A | Discharge status: Home / Self Care | Payer: Managed Care, Other (non HMO) | Source: Ambulatory Visit | Attending: Obstetrics and Gynecology | Admitting: Obstetrics and Gynecology

## 2018-11-14 DIAGNOSIS — Z01419 Encounter for gynecological examination (general) (routine) without abnormal findings: Secondary | ICD-10-CM

## 2018-12-05 ENCOUNTER — Other Ambulatory Visit: Payer: Self-pay | Admitting: *Deleted

## 2018-12-05 MED ORDER — LOSARTAN POTASSIUM-HCTZ 50-12.5 MG PO TABS: 1.0000 | ORAL_TABLET | Freq: Every day | ORAL | 4 refills | Status: DC

## 2019-02-16 ENCOUNTER — Other Ambulatory Visit: Payer: Self-pay | Admitting: Obstetrics and Gynecology

## 2019-02-16 DIAGNOSIS — F411 Generalized anxiety disorder: Secondary | ICD-10-CM

## 2019-04-05 ENCOUNTER — Other Ambulatory Visit: Payer: Self-pay | Admitting: Obstetrics and Gynecology

## 2019-04-05 DIAGNOSIS — F411 Generalized anxiety disorder: Secondary | ICD-10-CM

## 2019-06-21 HISTORY — PX: WRIST SURGERY: SHX841

## 2019-07-24 ENCOUNTER — Ambulatory Visit (INDEPENDENT_AMBULATORY_CARE_PROVIDER_SITE_OTHER): Payer: Managed Care, Other (non HMO) | Admitting: Obstetrics and Gynecology

## 2019-07-24 ENCOUNTER — Encounter: Payer: Self-pay | Admitting: Obstetrics and Gynecology

## 2019-07-24 ENCOUNTER — Other Ambulatory Visit: Payer: Self-pay

## 2019-07-24 VITALS — BP 128/84 | HR 89 | Ht 62.0 in | Wt 143.8 lb

## 2019-07-24 DIAGNOSIS — F411 Generalized anxiety disorder: Secondary | ICD-10-CM

## 2019-07-24 DIAGNOSIS — Z6826 Body mass index (BMI) 26.0-26.9, adult: Secondary | ICD-10-CM

## 2019-07-24 DIAGNOSIS — Z23 Encounter for immunization: Secondary | ICD-10-CM | POA: Diagnosis not present

## 2019-07-24 DIAGNOSIS — I10 Essential (primary) hypertension: Secondary | ICD-10-CM

## 2019-07-24 DIAGNOSIS — Z01419 Encounter for gynecological examination (general) (routine) without abnormal findings: Secondary | ICD-10-CM | POA: Diagnosis not present

## 2019-07-24 MED ORDER — BUPROPION HCL ER (XL) 300 MG PO TB24: 300.0000 mg | ORAL_TABLET | Freq: Every day | ORAL | 4 refills | Status: DC

## 2019-07-24 MED ORDER — SERTRALINE HCL 50 MG PO TABS: 50.0000 mg | ORAL_TABLET | Freq: Every day | ORAL | 4 refills | Status: DC

## 2019-07-24 MED ORDER — ALPRAZOLAM 0.5 MG PO TABS: 0.5000 mg | ORAL_TABLET | Freq: Every evening | ORAL | 4 refills | Status: DC | PRN

## 2019-07-24 MED ORDER — LOSARTAN POTASSIUM-HCTZ 50-12.5 MG PO TABS: 1.0000 | ORAL_TABLET | Freq: Every day | ORAL | 4 refills | Status: DC

## 2019-07-24 NOTE — Progress Notes (Signed)
Subjective:   Meghan Sandoval is a 53 y.o. G2P0 Caucasian female here for a routine well-woman exam.  No LMP recorded. (Menstrual status: Perimenopausal).    Current complaints: increased stressors at work due to pandemic, no menses since January. Feeling fine hormonally. PCP: Meghan Sandoval       Work does labs next month, will have results sent to me.  Social History: Sexual: heterosexual Marital Status: married Living situation: with family Occupation: Clinical research associateparalegal and receptionist for [employer] Tobacco/alcohol: no tobacco use Illicit drugs: no history of illicit drug use  The following portions of the patient's history were reviewed and updated as appropriate: allergies, current medications, past family history, past medical history, past social history, past surgical history and problem list.  Past Medical History Past Medical History:  Diagnosis Date  . Anxiety   . Carpal tunnel syndrome of right wrist   . Hypercholesteremia   . Hypertension     Past Surgical History Past Surgical History:  Procedure Laterality Date  . COLONOSCOPY WITH PROPOFOL N/A 04/27/2017   Procedure: COLONOSCOPY WITH PROPOFOL;  Surgeon: Meghan Sandoval, Darren, MD;  [facility];  Service: Endoscopy;  Laterality: N/A;  . no past surgery    . TONSILLECTOMY     age 89    Gynecologic History G2P0  No LMP recorded. (Menstrual status: Perimenopausal). Contraception: post menopausal status Last Pap: 2017. Results were: normal Last mammogram: 10/2018. Results were: normal   Obstetric History OB History  Gravida Para Term Preterm AB Living  2         2  SAB TAB Ectopic Multiple Live Births          2    # Outcome Date GA Lbr Len/2nd Weight Sex Delivery Anes PTL Lv  2 Gravida 1996    F Vag-Spont   LIV  1 Gravida 1992    F Vag-Spont   LIV    Current Medications Current Outpatient Medications on File Prior to Visit  Medication Sig Dispense Refill  . ALPRAZolam (XANAX) 0.5 MG tablet Take 1 tablet (0.5 mg  total) by mouth at bedtime as needed for anxiety. 60 tablet 4  . buPROPion (WELLBUTRIN XL) 300 MG 24 hr tablet Take 300 mg by mouth daily.    Marland Kitchen. losartan-hydrochlorothiazide (HYZAAR) 50-12.5 MG tablet Take 1 tablet by mouth daily. 90 tablet 4  . sertraline (ZOLOFT) 50 MG tablet TAKE 1 TABLET BY MOUTH EVERY DAY 90 tablet 2  . aspirin 81 MG tablet Take 81 mg by mouth daily.     No current facility-administered medications on file prior to visit.     Review of Systems Patient denies any headaches, blurred vision, shortness of breath, chest pain, abdominal pain, problems with bowel movements, urination, or intercourse.  Objective:  BP 128/84   Pulse 89   Ht 5\' 2"  (1.575 m)   Wt 143 lb 12.8 oz (65.2 kg)   BMI 26.30 kg/m  Physical Exam  General:  Well developed, well nourished, no acute distress. She is alert and oriented x3. Skin:  Warm and dry Neck:  Midline trachea, no thyromegaly or nodules Cardiovascular: Regular rate and rhythm, no murmur heard Lungs:  Effort normal, all lung fields clear to auscultation bilaterally Breasts:  No dominant palpable mass, retraction, or nipple discharge Abdomen:  Soft, non tender, no hepatosplenomegaly or masses Pelvic:  External genitalia is normal in appearance.  The vagina is normal in appearance. The cervix is bulbous, no CMT.  Thin prep pap is not done . Uterus  is felt to be normal size, shape, and contour.  No adnexal masses or tenderness noted. Extremities:  No swelling or varicosities noted Psych:  She has a normal mood and affect  Assessment:   Healthy well-woman exam HTN Needs flu vaccine BMI 26 anxiety  Plan:  meds refilled Flu vaccine given F/U 1 year for AE, or sooner if needed Mammogram due in December Meghan Sandoval, [place]

## 2019-07-30 ENCOUNTER — Encounter: Payer: Self-pay | Admitting: Adult Health

## 2019-08-06 ENCOUNTER — Other Ambulatory Visit: Payer: Self-pay

## 2019-08-06 ENCOUNTER — Encounter: Payer: Self-pay | Admitting: Adult Health

## 2019-08-06 ENCOUNTER — Ambulatory Visit: Payer: Managed Care, Other (non HMO) | Admitting: Adult Health

## 2019-08-06 VITALS — BP 130/68 | HR 78 | Temp 98.3°F | Resp 16 | Ht 62.0 in | Wt 144.0 lb

## 2019-08-06 DIAGNOSIS — Z008 Encounter for other general examination: Secondary | ICD-10-CM | POA: Diagnosis not present

## 2019-08-06 DIAGNOSIS — Z0189 Encounter for other specified special examinations: Secondary | ICD-10-CM | POA: Diagnosis not present

## 2019-08-06 NOTE — Patient Instructions (Signed)
  I will have the office call you on your glucose and cholesterol results when they return if you have not heard within 1 week please call the office.  This biometric physical is a brief physical and the only labs done are glucose and your lipid panel(cholesterol) and is  not a substitute for seeing a primary care provider for a complete annual physical. Please see a primary care physician for routine health maintenance, labs and full physical at least yearly and follow up as recommended by your provider. Provider also recommends if you do not have a primary care provider for patient to establish care as soon as possible .Patient may chose provider of choice. Also gave the [facility]  PHYSICIAN/PROVIDER  REFERRAL LINE at [phone] or web site at [email or web address] to help assist with finding a primary care doctor.  Patient verbalizes understanding that his office is acute care only and not a substitute for a primary care or for the management of chronic conditions.    Health Maintenance, Female Adopting a healthy lifestyle and getting preventive care are important in promoting health and wellness. Ask your health care provider about:  The right schedule for you to have regular tests and exams.  Things you can do on your own to prevent diseases and keep yourself healthy. What should I know about diet, weight, and exercise? Eat a healthy diet   Eat a diet that includes plenty of vegetables, fruits, low-fat dairy products, and lean protein.  Do not eat a lot of foods that are high in solid fats, added sugars, or sodium. Maintain a healthy weight Body mass index (BMI) is used to identify weight problems. It estimates body fat based on height and weight. Your health care provider can help determine your BMI and help you achieve or maintain a healthy weight. Get regular exercise Get regular exercise. This is one of the most important things you can do for your health. Most adults should:   Exercise for at least 150 minutes each week. The exercise should increase your heart rate and make you sweat (moderate-intensity exercise).  Do strengthening exercises at least twice a week. This is in addition to the moderate-intensity exercise.  Spend less time sitting. Even light physical activity can be beneficial. Watch cholesterol and blood lipids Have your blood tested for lipids and cholesterol at 53 years of age, then have this test every 5 years. Have your cholesterol levels checked more often if:  Your lipid or cholesterol levels are high.  You are older than 53 years of age.  You are at high risk for heart disease. What should I know about cancer screening? Depending on your health history and family history, you may need to have cancer screening at various ages. This may include screening for:  Breast cancer.  Cervical cancer.  Colorectal cancer.  Skin cancer.  Lung cancer. What should I know about heart disease, diabetes, and high blood pressure? Blood pressure and heart disease  High blood pressure causes heart disease and increases the risk of stroke. This is more likely to develop in people who have high blood pressure readings, are of African descent, or are overweight.  Have your blood pressure checked: ? Every 3-5 years if you are 18-39 years of age. ? Every year if you are 40 years old or older. Diabetes Have regular diabetes screenings. This checks your fasting blood sugar level. Have the screening done:  Once every three years after age 40 if you are   at a normal weight and have a low risk for diabetes.  More often and at a younger age if you are overweight or have a high risk for diabetes. What should I know about preventing infection? Hepatitis B If you have a higher risk for hepatitis B, you should be screened for this virus. Talk with your health care provider to find out if you are at risk for hepatitis B infection. Hepatitis C Testing is  recommended for:  Everyone born from 1945 through 1965.  Anyone with known risk factors for hepatitis C. Sexually transmitted infections (STIs)  Get screened for STIs, including gonorrhea and chlamydia, if: ? You are sexually active and are younger than 53 years of age. ? You are older than 53 years of age and your health care provider tells you that you are at risk for this type of infection. ? Your sexual activity has changed since you were last screened, and you are at increased risk for chlamydia or gonorrhea. Ask your health care provider if you are at risk.  Ask your health care provider about whether you are at high risk for HIV. Your health care provider may recommend a prescription medicine to help prevent HIV infection. If you choose to take medicine to prevent HIV, you should first get tested for HIV. You should then be tested every 3 months for as long as you are taking the medicine. Pregnancy  If you are about to stop having your period (premenopausal) and you may become pregnant, seek counseling before you get pregnant.  Take 400 to 800 micrograms (mcg) of folic acid every day if you become pregnant.  Ask for birth control (contraception) if you want to prevent pregnancy. Osteoporosis and menopause Osteoporosis is a disease in which the bones lose minerals and strength with aging. This can result in bone fractures. If you are 65 years old or older, or if you are at risk for osteoporosis and fractures, ask your health care provider if you should:  Be screened for bone loss.  Take a calcium or vitamin D supplement to lower your risk of fractures.  Be given hormone replacement therapy (HRT) to treat symptoms of menopause. Follow these instructions at home: Lifestyle  Do not use any products that contain nicotine or tobacco, such as cigarettes, e-cigarettes, and chewing tobacco. If you need help quitting, ask your health care provider.  Do not use street drugs.  Do not  share needles.  Ask your health care provider for help if you need support or information about quitting drugs. Alcohol use  Do not drink alcohol if: ? Your health care provider tells you not to drink. ? You are pregnant, may be pregnant, or are planning to become pregnant.  If you drink alcohol: ? Limit how much you use to 0-1 drink a day. ? Limit intake if you are breastfeeding.  Be aware of how much alcohol is in your drink. In the [place] one drink equals one 12 oz bottle of beer (355 mL), one 5 oz glass of wine (148 mL), or one 1 oz glass of hard liquor (44 mL). General instructions  Schedule regular health, dental, and eye exams.  Stay current with your vaccines.  Tell your health care provider if: ? You often feel depressed. ? You have ever been abused or do not feel safe at home. Summary  Adopting a healthy lifestyle and getting preventive care are important in promoting health and wellness.  Follow your health care provider's instructions about   healthy diet, exercising, and getting tested or screened for diseases.  Follow your health care provider's instructions on monitoring your cholesterol and blood pressure. This information is not intended to replace advice given to you by your health care provider. Make sure you discuss any questions you have with your health care provider. Document Released: 05/22/2011 Document Revised: 10/30/2018 Document Reviewed: 10/30/2018 Elsevier Patient Education  2020 Elsevier Inc.  

## 2019-08-06 NOTE — Progress Notes (Signed)
[place] D Cowdery DOB: 53 y.o. MRN: [id number]  Subjective:  Here for Biometric Screen/brief exam Patient is a 53 year old female in no acute distress who comes the clinic for her biometric screening.  She has a primary care provider Mable Paris, [place].  She also has reports that she sees a gynecologist for her regular yearly Pap smear.  She reports she eats healthy and tries to exercise though she reports her exercise has room for improvement.   Patient  denies any fever, body aches,chills, rash, chest pain, shortness of breath, nausea, vomiting, or diarrhea.   Objective: Blood pressure 130/68, pulse 78, temperature 98.3 F (36.8 C), temperature source Temporal, resp. rate 16, height 5\' 2"  (1.575 m), weight 144 lb (65.3 kg), SpO2 100 %. NAD, well developed well noursihed HEENT: Within normal limits Neck: Normal, supple, no cervical lymphadenopathy Heart: Regular rate and rhythm without murmurs, rubs or gallops  Lungs: Clear to auscultation without any adventitious lung sounds  Assessment: Biometric screen 1. Encounter for other general examination- not a full physical - brief biometric exam with biometric screening    2. Encounter for biometric screening     Plan:  Fasting glucose and lipids. Discussed with patient that today's visit here is a limited biometric screening visit (not a comprehensive exam or management of any chronic problems) Discussed some health issues, including healthy eating habits and exercise. Encouraged to follow-up with PCP for annual comprehensive preventive and wellness care (and if applicable, any chronic issues). Questions invited and answered.   I will have the office call you on your glucose and cholesterol results when they return if you have not heard within 1 week please call the office.  This biometric physical is a brief physical and the only labs done are glucose and your lipid  panel(cholesterol) and is  not a substitute for seeing a primary care provider for a complete annual physical. Please see a primary care physician for routine health maintenance, labs and full physical at least yearly and follow up as recommended by your provider. Provider also recommends if you do not have a primary care provider for patient to establish care as soon as possible .Patient may chose provider of choice. Also gave the [facility] at [phone] or web site at [email or web address] to help assist with finding a primary care doctor.  Patient verbalizes understanding that his office is acute care only and not a substitute for a primary care or for the management of chronic conditions.

## 2019-08-07 LAB — GLUCOSE, RANDOM: Glucose: 85 mg/dL (ref 65–99)

## 2019-08-07 LAB — LIPID PANEL WITH LDL/HDL RATIO
Cholesterol, Total: 317 mg/dL — ABNORMAL HIGH (ref 100–199)
HDL: 54 mg/dL (ref >39)
LDL Chol Calc (NIH): 225 mg/dL — ABNORMAL HIGH (ref 0–99)
LDL/HDL Ratio: 4.2 ratio — ABNORMAL HIGH (ref 0.0–3.2)
Triglycerides: 193 mg/dL — ABNORMAL HIGH (ref 0–149)
VLDL Cholesterol Cal: 38 mg/dL (ref 5–40)

## 2019-08-11 ENCOUNTER — Encounter: Payer: Self-pay | Admitting: Adult Health

## 2019-08-11 NOTE — Progress Notes (Signed)
Meghan Sandoval,  I wanted to let you know that your glucose is normal at 85 normal ranges 65-99. Your cholesterol is out of control if you were truly fasting for these labs your total cholesterol is 317, normal ranges 101-199.  Your LDL is your bad cholesterol and it is elevated at 225 we like this number to be less than 99.  Your triglycerides are 193 normal ranges 0-1 49.  Your HDL is your good cholesterol it is good at 54 we like this number to be as higher than 39.  I recommend you following up with your primary care provider within 1 to 2 weeks for a follow-up visit regarding these numbers as well as increased exercise daily at least 30 minutes/day and aggressive lifestyle and dietary changes avoiding high cholesterol foods and high triglyceride foods.  Please let me know should you have any questions and please call your primary care provider for a follow-up appointment. Thanks, Laverna Peace MSN, AGNP-C, FNP-C

## 2019-12-02 ENCOUNTER — Telehealth: Payer: Self-pay | Admitting: Certified Nurse Midwife

## 2019-12-02 NOTE — Telephone Encounter (Signed)
Pt is trying to make an appoint with Meghan Sandoval send an order when she was her for her to get a mammogram. Pt is requesting a call once the order in placed. Please advise

## 2019-12-03 NOTE — Telephone Encounter (Signed)
Pt stated that the order was expired...Marland KitchenMarland Kitchen pt stated a need order needed to be sent over

## 2019-12-04 ENCOUNTER — Telehealth: Payer: Self-pay

## 2019-12-04 ENCOUNTER — Other Ambulatory Visit: Payer: Self-pay

## 2019-12-04 DIAGNOSIS — Z1231 Encounter for screening mammogram for malignant neoplasm of breast: Secondary | ICD-10-CM

## 2019-12-04 NOTE — Telephone Encounter (Signed)
Order for bilateral mammogram placed.  °

## 2019-12-09 ENCOUNTER — Telehealth: Payer: Self-pay

## 2019-12-09 ENCOUNTER — Other Ambulatory Visit: Payer: Self-pay

## 2019-12-09 DIAGNOSIS — Z1231 Encounter for screening mammogram for malignant neoplasm of breast: Secondary | ICD-10-CM

## 2019-12-09 NOTE — Telephone Encounter (Signed)
Patient called to request order for screening mammogram.  Order placed and patient aware.

## 2020-01-01 ENCOUNTER — Ambulatory Visit
Admission: RE | Admit: 2020-01-01 | Discharge: 2020-01-01 | Disposition: A | Discharge status: Home / Self Care | Payer: Managed Care, Other (non HMO) | Source: Ambulatory Visit | Attending: Certified Nurse Midwife | Admitting: Certified Nurse Midwife

## 2020-01-01 DIAGNOSIS — Z1231 Encounter for screening mammogram for malignant neoplasm of breast: Secondary | ICD-10-CM | POA: Diagnosis not present

## 2020-01-21 ENCOUNTER — Telehealth: Payer: Self-pay | Admitting: Certified Nurse Midwife

## 2020-01-21 NOTE — Telephone Encounter (Signed)
Patient called saying she needs her Alprazolam prescription refilled. Patient was seen by Melody in September, she has an upcoming appointment for September but stated she would rather not come in for an appointment to get in refilled if she didn't have to.  -TC

## 2020-01-21 NOTE — Telephone Encounter (Signed)
Called and spoke with patient.  Advised patient that our protocol is for her to be seen since [facility], CNM is no longer with our practice and she will need to be reevaluated before new prescription would be sent in.  Offered to schedule appointment but patient stated she will call back to schedule when she can.

## 2020-03-22 ENCOUNTER — Ambulatory Visit: Payer: Managed Care, Other (non HMO) | Admitting: Family

## 2020-04-01 ENCOUNTER — Other Ambulatory Visit: Payer: Self-pay

## 2020-04-01 ENCOUNTER — Encounter: Payer: Self-pay | Admitting: Surgical

## 2020-04-01 ENCOUNTER — Ambulatory Visit (INDEPENDENT_AMBULATORY_CARE_PROVIDER_SITE_OTHER): Payer: Managed Care, Other (non HMO) | Admitting: Certified Nurse Midwife

## 2020-04-01 ENCOUNTER — Encounter: Payer: Self-pay | Admitting: Certified Nurse Midwife

## 2020-04-01 VITALS — BP 165/108 | HR 77 | Ht 62.0 in | Wt 142.6 lb

## 2020-04-01 DIAGNOSIS — G479 Sleep disorder, unspecified: Secondary | ICD-10-CM

## 2020-04-01 DIAGNOSIS — Z79899 Other long term (current) drug therapy: Secondary | ICD-10-CM

## 2020-04-01 DIAGNOSIS — F411 Generalized anxiety disorder: Secondary | ICD-10-CM

## 2020-04-01 MED ORDER — ALPRAZOLAM 0.5 MG PO TABS: 0.5000 mg | ORAL_TABLET | Freq: Every day | ORAL | 1 refills | Status: DC | PRN

## 2020-04-01 NOTE — Patient Instructions (Signed)
Alprazolam tablets What is this medicine? ALPRAZOLAM (al PRAY zoe lam) is a benzodiazepine. It is used to treat anxiety and panic attacks. This medicine may be used for other purposes; ask your health care provider or pharmacist if you have questions. COMMON BRAND NAME(S): Xanax What should I tell my health care provider before I take this medicine? They need to know if you have any of these conditions:  an alcohol or drug abuse problem  bipolar disorder, depression, psychosis or other mental health conditions  glaucoma  kidney or liver disease  lung or breathing disease  myasthenia gravis  Parkinson's disease  porphyria  seizures or a history of seizures  suicidal thoughts  an unusual or allergic reaction to alprazolam, other benzodiazepines, foods, dyes, or preservatives  pregnant or trying to get pregnant  breast-feeding How should I use this medicine? Take this medicine by mouth with a glass of water. Follow the directions on the prescription label. Take your medicine at regular intervals. Do not take it more often than directed. Do not stop taking except on your doctor's advice. A special MedGuide will be given to you by the pharmacist with each prescription and refill. Be sure to read this information carefully each time. Talk to your pediatrician regarding the use of this medicine in children. Special care may be needed. Overdosage: If you think you have taken too much of this medicine contact a poison control center or emergency room at once. NOTE: This medicine is only for you. Do not share this medicine with others. What if I miss a dose? If you miss a dose, take it as soon as you can. If it is almost time for your next dose, take only that dose. Do not take double or extra doses. What may interact with this medicine? Do not take this medicine with any of the following medications:  certain antiviral medicines for HIV or AIDS like delavirdine,  indinavir  certain medicines for fungal infections like ketoconazole and itraconazole  narcotic medicines for cough  sodium oxybate This medicine may also interact with the following medications:  alcohol  antihistamines for allergy, cough and cold  certain antibiotics like clarithromycin, erythromycin, isoniazid, rifampin, rifapentine, rifabutin, and troleandomycin  certain medicines for blood pressure, heart disease, irregular heart beat  certain medicines for depression, like amitriptyline, fluoxetine, sertraline  certain medicines for seizures like carbamazepine, oxcarbazepine, phenobarbital, phenytoin, primidone  cimetidine  cyclosporine  female hormones, like estrogens or progestins and birth control pills, patches, rings, or injections  general anesthetics like halothane, isoflurane, methoxyflurane, propofol  grapefruit juice  local anesthetics like lidocaine, pramoxine, tetracaine  medicines that relax muscles for surgery  narcotic medicines for pain  other antiviral medicines for HIV or AIDS  phenothiazines like chlorpromazine, mesoridazine, prochlorperazine, thioridazine This list may not describe all possible interactions. Give your health care provider a list of all the medicines, herbs, non-prescription drugs, or dietary supplements you use. Also tell them if you smoke, drink alcohol, or use illegal drugs. Some items may interact with your medicine. What should I watch for while using this medicine? Tell your doctor or health care professional if your symptoms do not start to get better or if they get worse. Do not stop taking except on your doctor's advice. You may develop a severe reaction. Your doctor will tell you how much medicine to take. You may get drowsy or dizzy. Do not drive, use machinery, or do anything that needs mental alertness until you know how this medicine affects  you. To reduce the risk of dizzy and fainting spells, do not stand or sit up  quickly, especially if you are an older patient. Alcohol may increase dizziness and drowsiness. Avoid alcoholic drinks. If you are taking another medicine that also causes drowsiness, you may have more side effects. Give your health care provider a list of all medicines you use. Your doctor will tell you how much medicine to take. Do not take more medicine than directed. Call emergency for help if you have problems breathing or unusual sleepiness. What side effects may I notice from receiving this medicine? Side effects that you should report to your doctor or health care professional as soon as possible:  allergic reactions like skin rash, itching or hives, swelling of the face, lips, or tongue  breathing problems  confusion  loss of balance or coordination  signs and symptoms of low blood pressure like dizziness; feeling faint or lightheaded, falls; unusually weak or tired  suicidal thoughts or other mood changes Side effects that usually do not require medical attention (report to your doctor or health care professional if they continue or are bothersome):  dizziness  dry mouth  nausea, vomiting  tiredness This list may not describe all possible side effects. Call your doctor for medical advice about side effects. You may report side effects to FDA at 1-800-FDA-1088. Where should I keep my medicine? Keep out of the reach of children. This medicine can be abused. Keep your medicine in a safe place to protect it from theft. Do not share this medicine with anyone. Selling or giving away this medicine is dangerous and against the law. Store at room temperature between 20 and 25 degrees C (68 and 77 degrees F). This medicine may cause accidental overdose and death if taken by other adults, children, or pets. Mix any unused medicine with a substance like cat litter or coffee grounds. Then throw the medicine away in a sealed container like a sealed bag or a coffee can with a lid. Do not use  the medicine after the expiration date. NOTE: This sheet is a summary. It may not cover all possible information. If you have questions about this medicine, talk to your doctor, pharmacist, or health care provider.  2020 Elsevier/Gold Standard (2015-08-05 13:47:25)   Zolpidem tablets What is this medicine? ZOLPIDEM (zole PI dem) is used to treat insomnia. This medicine helps you to fall asleep and sleep through the night. This medicine may be used for other purposes; ask your health care provider or pharmacist if you have questions. COMMON BRAND NAME(S): Ambien What should I tell my health care provider before I take this medicine? They need to know if you have any of these conditions:  depression  history of drug abuse or addiction  if you often drink alcohol  liver disease  lung or breathing disease  myasthenia gravis  sleep apnea  sleep-walking, driving, eating or other activity while not fully awake after taking a sleep medicine  suicidal thoughts, plans, or attempt; a previous suicide attempt by you or a family member  an unusual or allergic reaction to zolpidem, other medicines, foods, dyes, or preservatives  pregnant or trying to get pregnant  breast-feeding How should I use this medicine? Take this medicine by mouth with a glass of water. Follow the directions on the prescription label. It is better to take this medicine on an empty stomach and only when you are ready for bed. Do not take your medicine more often than directed.  If you have been taking this medicine for several weeks and suddenly stop taking it, you may get unpleasant withdrawal symptoms. Your doctor or health care professional may want to gradually reduce the dose. Do not stop taking this medicine on your own. Always follow your doctor or health care professional's advice. A special MedGuide will be given to you by the pharmacist with each prescription and refill. Be sure to read this information  carefully each time. Talk to your pediatrician regarding the use of this medicine in children. Special care may be needed. Overdosage: If you think you have taken too much of this medicine contact a poison control center or emergency room at once. NOTE: This medicine is only for you. Do not share this medicine with others. What if I miss a dose? This does not apply. This medicine should only be taken immediately before going to sleep. Do not take double or extra doses. What may interact with this medicine?  alcohol  antihistamines for allergy, cough and cold  certain medicines for anxiety or sleep  certain medicines for depression, like amitriptyline, fluoxetine, sertraline  certain medicines for fungal infections like ketoconazole and itraconazole  certain medicines for seizures like phenobarbital, primidone  ciprofloxacin  dietary supplements for sleep, like valerian or kava kava  general anesthetics like halothane, isoflurane, methoxyflurane, propofol  local anesthetics like lidocaine, pramoxine, tetracaine  medicines that relax muscles for surgery  narcotic medicines for pain  phenothiazines like chlorpromazine, mesoridazine, prochlorperazine, thioridazine  rifampin This list may not describe all possible interactions. Give your health care provider a list of all the medicines, herbs, non-prescription drugs, or dietary supplements you use. Also tell them if you smoke, drink alcohol, or use illegal drugs. Some items may interact with your medicine. What should I watch for while using this medicine? Visit your doctor or health care professional for regular checks on your progress. Keep a regular sleep schedule by going to bed at about the same time each night. Avoid caffeine-containing drinks in the evening hours. When sleep medicines are used every night for more than a few weeks, they may stop working. Talk to your doctor if you still have trouble sleeping. After taking this  medicine, you may get up out of bed and do an activity that you do not know you are doing. The next morning, you may have no memory of this. Activities include driving a car ("sleep-driving"), making and eating food, talking on the phone, sexual activity, and sleep-walking. Serious injuries have occurred. Stop the medicine and call your doctor right away if you find out you have done any of these activities. Do not take this medicine if you have used alcohol that evening. Do not take it if you have taken another medicine for sleep. The risk of doing these sleep-related activities is higher. Wait for at least 8 hours after you take a dose before driving or doing other activities that require full mental alertness. Do not take this medicine unless you are able to stay in bed for a full night (7 to 8 hours) before you must be active again. You may have a decrease in mental alertness the day after use, even if you feel that you are fully awake. Tell your doctor if you will need to perform activities requiring full alertness, such as driving, the next day. Do not stand or sit up quickly after taking this medicine, especially if you are an older patient. This reduces the risk of dizzy or fainting spells. If  you or your family notice any changes in your behavior, such as new or worsening depression, thoughts of harming yourself, anxiety, other unusual or disturbing thoughts, or memory loss, call your doctor right away. After you stop taking this medicine, you may have trouble falling asleep. This is called rebound insomnia. This problem usually goes away on its own after 1 or 2 nights. What side effects may I notice from receiving this medicine? Side effects that you should report to your doctor or health care professional as soon as possible:  allergic reactions like skin rash, itching or hives, swelling of the face, lips, or tongue  breathing problems  changes in vision  confusion  depressed mood or other  changes in moods or emotions  feeling faint or lightheaded, falls  hallucinations  loss of balance or coordination  loss of memory  numbness or tingling of the tongue  restlessness, excitability, or feelings of anxiety or agitation  signs and symptoms of liver injury like dark yellow or brown urine; general ill feeling or flu-like symptoms; light-colored stools; loss of appetite; nausea; right upper belly pain; unusually weak or tired; yellowing of the eyes or skin  suicidal thoughts  unusual activities while not fully awake like driving, eating, making phone calls, or sexual activity Side effects that usually do not require medical attention (report to your doctor or health care professional if they continue or are bothersome):  dizziness  drowsiness the day after you take this medicine  headache This list may not describe all possible side effects. Call your doctor for medical advice about side effects. You may report side effects to FDA at 1-800-FDA-1088. Where should I keep my medicine? Keep out of the reach of children. This medicine can be abused. Keep your medicine in a safe place to protect it from theft. Do not share this medicine with anyone. Selling or giving away this medicine is dangerous and against the law. This medicine may cause accidental overdose and death if taken by other adults, children, or pets. Mix any unused medicine with a substance like cat litter or coffee grounds. Then throw the medicine away in a sealed container like a sealed bag or a coffee can with a lid. Do not use the medicine after the expiration date. Store at room temperature between 20 and 25 degrees C (68 and 77 degrees F). NOTE: This sheet is a summary. It may not cover all possible information. If you have questions about this medicine, talk to your doctor, pharmacist, or health care provider.  2020 Elsevier/Gold Standard (2018-07-26 11:51:08)   Eszopiclone tablets What is this  medicine? ESZOPICLONE (es ZOE pi clone) is used to treat insomnia. This medicine helps you to fall asleep and sleep through the night. This medicine may be used for other purposes; ask your health care provider or pharmacist if you have questions. COMMON BRAND NAME(S): Lunesta What should I tell my health care provider before I take this medicine? They need to know if you have any of these conditions:  depression  history of a drug or alcohol abuse problem  liver disease  lung or breathing disease  sleep-walking, driving, eating or other activity while not fully awake after taking a sleep medicine  suicidal thoughts  an unusual or allergic reaction to eszopiclone, other medicines, foods, dyes, or preservatives  pregnant or trying to get pregnant  breast-feeding How should I use this medicine? Take this medicine by mouth with a glass of water. Follow the directions on the prescription  label. It is better to take this medicine on an empty stomach and only when you are ready for bed. Do not take your medicine more often than directed. If you have been taking this medicine for several weeks and suddenly stop taking it, you may get unpleasant withdrawal symptoms. Your doctor or health care professional may want to gradually reduce the dose. Do not stop taking this medicine on your own. Always follow your doctor or health care professional's advice. Talk to your pediatrician regarding the use of this medicine in children. Special care may be needed. Overdosage: If you think you have taken too much of this medicine contact a poison control center or emergency room at once. NOTE: This medicine is only for you. Do not share this medicine with others. What if I miss a dose? This does not apply. This medicine should only be taken immediately before going to sleep. Do not take double or extra doses. What may interact with this medicine?  herbal medicines like kava kava, melatonin, [place] [employer]  wort and valerian  lorazepam  medicines for fungal infections like ketoconazole, fluconazole, or itraconazole  olanzapine This list may not describe all possible interactions. Give your health care provider a list of all the medicines, herbs, non-prescription drugs, or dietary supplements you use. Also tell them if you smoke, drink alcohol, or use illegal drugs. Some items may interact with your medicine. What should I watch for while using this medicine? Visit your doctor or health care professional for regular checks on your progress. Keep a regular sleep schedule by going to bed at about the same time nightly. Avoid caffeine-containing drinks in the evening hours, as caffeine can cause trouble with falling asleep. Talk to your doctor if you still have trouble sleeping. After taking this medicine, you may get up out of bed and do an activity that you do not know you are doing. The next morning, you may have no memory of this. Activities include driving a car ("sleep-driving"), making and eating food, talking on the phone, sexual activity, and sleep-walking. Serious injuries have occurred. Stop the medicine and call your doctor right away if you find out you have done any of these activities. Do not take this medicine if you have used alcohol that evening. Do not take it if you have taken another medicine for sleep. The risk of doing these sleep-related activities is higher. Do not take this medicine unless you are able to stay in bed for a full night (7 to 8 hours) before you must be active again. You may have a decrease in mental alertness the day after use, even if you feel that you are fully awake. Tell your doctor if you will need to perform activities requiring full alertness, such as driving, the next day. Do not stand or sit up quickly after taking this medicine, especially if you are an older patient. This reduces the risk of dizzy or fainting spells. If you or your family notice any changes in  your behavior, such as new or worsening depression, thoughts of harming yourself, anxiety, other unusual or disturbing thoughts, or memory loss, call your doctor right away. After you stop taking this medicine, you may have trouble falling asleep. This is called rebound insomnia. This problem usually goes away on its own after 1 or 2 nights. What side effects may I notice from receiving this medicine? Side effects that you should report to your doctor or health care professional as soon as possible:  allergic  reactions like skin rash, itching or hives, swelling of the face, lips, or tongue  changes in vision  confusion  depressed mood  feeling faint or lightheaded, falls  hallucinations  problems with balance, speaking, walking  restlessness, excitability, or feelings of agitation  unusual activities while not fully awake like driving, eating, making phone calls Side effects that usually do not require medical attention (report to your doctor or health care professional if they continue or are bothersome):  dizziness, or daytime drowsiness, sometimes called a hangover effect  headache This list may not describe all possible side effects. Call your doctor for medical advice about side effects. You may report side effects to FDA at 1-800-FDA-1088. Where should I keep my medicine? Keep out of the reach of children. This medicine can be abused. Keep your medicine in a safe place to protect it from theft. Do not share this medicine with anyone. Selling or giving away this medicine is dangerous and against the law. This medicine may cause accidental overdose and death if taken by other adults, children, or pets. Mix any unused medicine with a substance like cat litter or coffee grounds. Then throw the medicine away in a sealed container like a sealed bag or a coffee can with a lid. Do not use the medicine after the expiration date. Store at room temperature between 15 and 30 degrees C (59  and 86 degrees F). NOTE: This sheet is a summary. It may not cover all possible information. If you have questions about this medicine, talk to your doctor, pharmacist, or health care provider.  2020 Elsevier/Gold Standard (2018-05-03 11:57:05)

## 2020-04-01 NOTE — Progress Notes (Signed)
GYN ENCOUNTER NOTE  Subjective:       Meghan Sandoval is a 54 y.o. G95P0 female here for medication refill.   Taking Xanax (0.5 mg) half to whole tablet as needed during the day (works a IT consultant) and nightly with Benadryl to sleep.   Reports elevated blood pressure today is due to anxiety of meeting new provider.   Denies difficulty breathing or respiratory distress, chest pain, abdominal pain, excessive vaginal bleeding, dysuria, and leg pain or swelling.    Gynecologic History  No LMP recorded. (Menstrual status: Perimenopausal).  Contraception: post menopausal status  Last Pap: 06/2017. Results were: Neg/Neg  Last mammogram: 12/2019. Results were: BI-RADS 1  Obstetric History  OB History  Gravida Para Term Preterm AB Living  2         2  SAB TAB Ectopic Multiple Live Births          2    # Outcome Date GA Lbr Len/2nd Weight Sex Delivery Anes PTL Lv  2 Gravida 1996    F Vag-Spont   LIV  1 Gravida 1992    F Vag-Spont   LIV    Past Medical History:  Diagnosis Date  . Anxiety   . Carpal tunnel syndrome of right wrist   . Hypercholesteremia   . Hypertension     Past Surgical History:  Procedure Laterality Date  . COLONOSCOPY WITH PROPOFOL N/A 04/27/2017   Procedure: COLONOSCOPY WITH PROPOFOL;  Surgeon: Midge Minium, MD;  Location: [facility];  Service: Endoscopy;  Laterality: N/A;  . no past surgery    . TONSILLECTOMY     age 62    Current Outpatient Medications on File Prior to Visit  Medication Sig Dispense Refill  . buPROPion (WELLBUTRIN XL) 300 MG 24 hr tablet Take 1 tablet (300 mg total) by mouth daily. 90 tablet 4  . losartan-hydrochlorothiazide (HYZAAR) 50-12.5 MG tablet Take 1 tablet by mouth daily. 90 tablet 4  . sertraline (ZOLOFT) 50 MG tablet Take 1 tablet (50 mg total) by mouth daily. 90 tablet 4   No current facility-administered medications on file prior to visit.    No Known Allergies  Social History   Socioeconomic History  .  Marital status: Significant Other    Spouse name: Not on file  . Number of children: 2  . Years of education: Advice worker  . Highest education level: Not on file  Occupational History  . Occupation: Scientist, physiological: [employer]  Tobacco Use  . Smoking status: Never Smoker  . Smokeless tobacco: Never Used  Substance and Sexual Activity  . Alcohol use: Yes    Alcohol/week: 2.0 standard drinks    Types: 2 Glasses of wine per week    Comment: occasionally  . Drug use: No  . Sexual activity: Yes    Partners: Male    Birth control/protection: Post-menopausal  Other Topics Concern  . Not on file  Social History Narrative   Patient lives at home with fiance.    Caffeine Use: 2-3 cups daily   Employed as a Psychologist, sport and exercise    Some college    2 children (1 daughter, Chief Financial Officer, works as a Clinical biochemist at [employer])    Social Determinants of Corporate investment banker Strain:   . Difficulty of Paying Living Expenses:   Food Insecurity:   . Worried About Programme researcher, broadcasting/film/video in the Last Year:   . [employer] of Food in the Last Year:  Transportation Needs:   . Film/video editor (Medical):   Marland Kitchen Lack of Transportation (Non-Medical):   Physical Activity:   . Days of Exercise per Week:   . Minutes of Exercise per Session:   Stress:   . Feeling of Stress :   Social Connections:   . Frequency of Communication with Friends and Family:   . Frequency of Social Gatherings with Friends and Family:   . Attends Religious Services:   . Active Member of Clubs or Organizations:   . Attends Archivist Meetings:   Marland Kitchen Marital Status:   Intimate Partner Violence:   . Fear of Current or Ex-Partner:   . Emotionally Abused:   Marland Kitchen Physically Abused:   . Sexually Abused:     Family History  Problem Relation Age of Onset  . Other Mother        gastrointestinal  . Breast cancer Neg Hx     The following portions of the patient's history were reviewed and updated as  appropriate: allergies, current medications, past family history, past medical history, past social history, past surgical history and problem list.  Review of Systems  ROS negative except as noted above. Information obtained from patient.   Objective:   BP (!) 165/108   Pulse 77   Ht 5\' 2"  (1.575 m)   Wt 142 lb 9.6 oz (64.7 kg)   BMI 26.08 kg/m    CONSTITUTIONAL: Well-developed, well-nourished female in no acute distress.   PHYSICAL EXAM: Not indicated.   GAD 7 : Generalized Anxiety Score 04/01/2020  Nervous, Anxious, on Edge 2  Control/stop worrying 1  Worry too much - different things 1  Trouble relaxing 1  Restless 1  Easily annoyed or irritable 1  Afraid - awful might happen 0  Total GAD 7 Score 7  Anxiety Difficulty Not difficult at all    Depression screen [facility] 2/9 04/01/2020 07/18/2016 04/19/2016  Decreased Interest 0 0 0  Down, Depressed, Hopeless 0 0 0  PHQ - 2 Score 0 0 0  Altered sleeping 2 - -  Tired, decreased energy 1 - -  Change in appetite 1 - -  Feeling bad or failure about yourself  0 - -  Trouble concentrating 1 - -  Moving slowly or fidgety/restless 1 - -  Suicidal thoughts 0 - -  PHQ-9 Score 6 - -  Difficult doing work/chores Not difficult at all - -    Assessment:   1. GAD (generalized anxiety disorder)   2. Medication management   3. Sleep disorder  Plan:   Discuss medication use and controlled substance prescription policy, verbalized understanding.   Rx Xanax and Lunesta, see orders.   Reviewed red flag symptoms and when to all.   RTC as previously scheduled or sooner if needed.    Diona Fanti, CNM [facility], [facility] 04/01/20 5:28 PM

## 2020-04-01 NOTE — Progress Notes (Signed)
Patient comes in today for medication refill. No other concerns today.

## 2020-04-02 MED ORDER — ESZOPICLONE 1 MG PO TABS: 1.0000 mg | ORAL_TABLET | Freq: Every evening | ORAL | 0 refills | Status: DC | PRN

## 2020-04-09 ENCOUNTER — Other Ambulatory Visit (INDEPENDENT_AMBULATORY_CARE_PROVIDER_SITE_OTHER): Payer: Managed Care, Other (non HMO) | Admitting: Certified Nurse Midwife

## 2020-04-09 DIAGNOSIS — G479 Sleep disorder, unspecified: Secondary | ICD-10-CM

## 2020-04-09 MED ORDER — ESZOPICLONE 2 MG PO TABS: 2.0000 mg | ORAL_TABLET | Freq: Every evening | ORAL | 2 refills | Status: DC | PRN

## 2020-04-09 NOTE — Progress Notes (Signed)
Rx Lunesta, see orders.    Gunnar Bulla, CNM [facility], [facility] 04/09/20 6:28 PM

## 2020-05-14 ENCOUNTER — Other Ambulatory Visit: Payer: Self-pay | Admitting: Certified Nurse Midwife

## 2020-05-19 NOTE — Telephone Encounter (Signed)
Sent to JML to approve or deny.  

## 2020-05-24 ENCOUNTER — Other Ambulatory Visit: Payer: Self-pay | Admitting: Certified Nurse Midwife

## 2020-05-24 DIAGNOSIS — G479 Sleep disorder, unspecified: Secondary | ICD-10-CM

## 2020-05-24 MED ORDER — TRAZODONE HCL 50 MG PO TABS: 50.0000 mg | ORAL_TABLET | Freq: Every day | ORAL | 0 refills | Status: DC

## 2020-05-24 NOTE — Progress Notes (Signed)
Rx Trazodone, see orders.   Meghan Sandoval, CNM [facility], [facility] 05/24/20 9:36 AM

## 2020-06-15 ENCOUNTER — Other Ambulatory Visit: Payer: Self-pay | Admitting: Certified Nurse Midwife

## 2020-07-13 ENCOUNTER — Encounter: Payer: Self-pay | Admitting: Physician Assistant

## 2020-07-13 ENCOUNTER — Ambulatory Visit: Payer: Managed Care, Other (non HMO) | Admitting: Physician Assistant

## 2020-07-13 ENCOUNTER — Other Ambulatory Visit: Payer: Self-pay

## 2020-07-13 VITALS — BP 128/80 | HR 76 | Temp 97.6°F | Resp 16 | Ht 62.0 in | Wt 139.0 lb

## 2020-07-13 DIAGNOSIS — Z0000 Encounter for general adult medical examination without abnormal findings: Secondary | ICD-10-CM | POA: Diagnosis not present

## 2020-07-13 DIAGNOSIS — Z008 Encounter for other general examination: Secondary | ICD-10-CM
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
## 2020-07-14 LAB — GLUCOSE, RANDOM: Glucose: 88 mg/dL (ref 65–99)

## 2020-07-14 LAB — LIPID PANEL
Chol/HDL Ratio: 4.9 ratio — ABNORMAL HIGH (ref 0.0–4.4)
Cholesterol, Total: 270 mg/dL — ABNORMAL HIGH (ref 100–199)
HDL: 55 mg/dL (ref >39)
LDL Chol Calc (NIH): 187 mg/dL — ABNORMAL HIGH (ref 0–99)
Triglycerides: 154 mg/dL — ABNORMAL HIGH (ref 0–149)
VLDL Cholesterol Cal: 28 mg/dL (ref 5–40)

## 2020-07-29 ENCOUNTER — Encounter: Payer: Self-pay | Admitting: Certified Nurse Midwife

## 2020-07-29 ENCOUNTER — Other Ambulatory Visit: Payer: Self-pay

## 2020-07-29 ENCOUNTER — Ambulatory Visit (INDEPENDENT_AMBULATORY_CARE_PROVIDER_SITE_OTHER): Payer: Managed Care, Other (non HMO) | Admitting: Certified Nurse Midwife

## 2020-07-29 ENCOUNTER — Other Ambulatory Visit (HOSPITAL_COMMUNITY)
Admission: RE | Admit: 2020-07-29 | Discharge: 2020-07-29 | Disposition: A | Discharge status: Home / Self Care | Payer: Managed Care, Other (non HMO) | Source: Ambulatory Visit | Attending: Certified Nurse Midwife | Admitting: Certified Nurse Midwife

## 2020-07-29 VITALS — BP 128/82 | Ht 62.0 in | Wt 140.7 lb

## 2020-07-29 DIAGNOSIS — Z124 Encounter for screening for malignant neoplasm of cervix: Secondary | ICD-10-CM

## 2020-07-29 DIAGNOSIS — Z1231 Encounter for screening mammogram for malignant neoplasm of breast: Secondary | ICD-10-CM | POA: Diagnosis not present

## 2020-07-29 DIAGNOSIS — Z01419 Encounter for gynecological examination (general) (routine) without abnormal findings: Secondary | ICD-10-CM

## 2020-07-29 DIAGNOSIS — G479 Sleep disorder, unspecified: Secondary | ICD-10-CM

## 2020-07-29 DIAGNOSIS — F411 Generalized anxiety disorder: Secondary | ICD-10-CM | POA: Diagnosis not present

## 2020-07-29 MED ORDER — TRAZODONE HCL 50 MG PO TABS
ORAL_TABLET | ORAL | 1 refills | Status: DC
Start: 2020-07-29 — End: 2021-03-21

## 2020-07-29 MED ORDER — LOSARTAN POTASSIUM-HCTZ 50-12.5 MG PO TABS: 1.0000 | ORAL_TABLET | Freq: Every day | ORAL | 4 refills | Status: DC

## 2020-07-29 MED ORDER — SERTRALINE HCL 50 MG PO TABS
50.0000 mg | ORAL_TABLET | Freq: Every day | ORAL | 4 refills | Status: DC
Start: 2020-07-29 — End: 2021-08-08

## 2020-07-29 MED ORDER — ALPRAZOLAM 0.5 MG PO TABS: 0.5000 mg | ORAL_TABLET | Freq: Every day | ORAL | 1 refills | Status: DC | PRN

## 2020-07-29 MED ORDER — BUPROPION HCL ER (XL) 300 MG PO TB24: 300.0000 mg | ORAL_TABLET | Freq: Every day | ORAL | 4 refills | Status: DC

## 2020-07-29 NOTE — Progress Notes (Signed)
ANNUAL PREVENTATIVE CARE GYN  ENCOUNTER NOTE  Subjective:       Meghan Sandoval is a 54 y.o. G2P0 female here for a routine annual gynecologic exam.  Current complaints: 1. Needs medications refills  Denies difficulty breathing or respiratory distress, chest pain, abdominal pain, vaginal bleeding, dysuria, and leg pain or swelling.    Gynecologic History  No LMP recorded. Patient is postmenopausal.  Contraception: post menopausal status  Last Pap: 06/2017. Results were: Neg/Neg  Last mammogram: 12/2019. Results were: BI-RADS 1  Last colonoscopy: 04/2017. Results were: Normal   Obstetric History  OB History  Gravida Para Term Preterm AB Living  2         2  SAB TAB Ectopic Multiple Live Births          2    # Outcome Date GA Lbr Len/2nd Weight Sex Delivery Anes PTL Lv  2 Gravida 1996    F Vag-Spont   LIV  1 Gravida 1992    F Vag-Spont   LIV    Past Medical History:  Diagnosis Date  . Anxiety   . Carpal tunnel syndrome of right wrist   . Hypercholesteremia   . Hypertension     Past Surgical History:  Procedure Laterality Date  . COLONOSCOPY WITH PROPOFOL N/A 04/27/2017   Procedure: COLONOSCOPY WITH PROPOFOL;  Surgeon: Midge Minium, MD;  Location: [facility];  Service: Endoscopy;  Laterality: N/A;  . no past surgery    . TONSILLECTOMY     age 71  . WRIST SURGERY  06/2019   carpal tunnel    No current outpatient medications on file prior to visit.   No current facility-administered medications on file prior to visit.    No Known Allergies  Social History   Socioeconomic History  . Marital status: Significant Other    Spouse name: Not on file  . Number of children: 2  . Years of education: Advice worker  . Highest education level: Not on file  Occupational History  . Occupation: Scientist, physiological: [employer]  Tobacco Use  . Smoking status: Never Smoker  . Smokeless tobacco: Never Used  Vaping Use  . Vaping Use: Never used  Substance and  Sexual Activity  . Alcohol use: Yes    Comment: occasionally  . Drug use: No  . Sexual activity: Yes    Partners: Male    Birth control/protection: Post-menopausal  Other Topics Concern  . Not on file  Social History Narrative   Patient lives at home with fiance.    Caffeine Use: 2-3 cups daily   Employed as a Psychologist, sport and exercise    Some college    2 children (1 daughter, Chief Financial Officer, works as a Clinical biochemist at [employer])    Social Determinants of Corporate investment banker Strain:   . Difficulty of Paying Living Expenses: Not on file  Food Insecurity:   . Worried About Programme researcher, broadcasting/film/video in the Last Year: Not on file  . Ran Out of Food in the Last Year: Not on file  Transportation Needs:   . Lack of Transportation (Medical): Not on file  . Lack of Transportation (Non-Medical): Not on file  Physical Activity:   . Days of Exercise per Week: Not on file  . Minutes of Exercise per Session: Not on file  Stress:   . Feeling of Stress : Not on file  Social Connections:   . Frequency of Communication with Friends and Family:  Not on file  . Frequency of Social Gatherings with Friends and Family: Not on file  . Attends Religious Services: Not on file  . Active Member of Clubs or Organizations: Not on file  . Attends Banker Meetings: Not on file  . Marital Status: Not on file  Intimate Partner Violence:   . Fear of Current or Ex-Partner: Not on file  . Emotionally Abused: Not on file  . Physically Abused: Not on file  . Sexually Abused: Not on file    Family History  Problem Relation Age of Onset  . Other Mother        gastrointestinal  . Hypertension Mother   . Migraines Daughter   . Migraines Daughter   . Breast cancer Neg Hx     The following portions of the patient's history were reviewed and updated as appropriate: allergies, current medications, past family history, past medical history, past social history, past surgical history and problem  list.  Review of Systems  ROS negative except as noted above. Information obtained from patient.    Objective:   BP 128/82   Ht 5\' 2"  (1.575 m)   Wt 140 lb 11.2 oz (63.8 kg)   BMI 25.73 kg/m   CONSTITUTIONAL: Well-developed, well-nourished female in no acute distress.   PSYCHIATRIC: Normal mood and affect. Normal behavior. Normal judgment and thought content.  NEUROLGIC: Alert and oriented to person, place, and time. Normal muscle tone coordination. No cranial nerve deficit noted.  HENT:  Normocephalic, atraumatic, External right and left ear normal.   EYES: Conjunctivae and EOM are normal. Pupils are equal and round.   NECK: Normal range of motion, supple, no masses.  Normal thyroid.   SKIN: Skin is warm and dry. No rash noted. Not diaphoretic. No erythema. No pallor.  CARDIOVASCULAR: Normal heart rate noted, regular rhythm, no murmur.  RESPIRATORY: Clear to auscultation bilaterally. Effort and breath sounds normal, no problems with respiration noted.  BREASTS: Symmetric in size. No masses, skin changes, nipple drainage, or lymphadenopathy.  ABDOMEN: Soft, normal bowel sounds, no distention noted.  No tenderness, rebound or guarding.   PELVIC:  External Genitalia: Normal  Vagina: Normal  Cervix: Normal  Uterus: Normal  Adnexa: Normal  RV: External Exam NormaI   MUSCULOSKELETAL: Normal range of motion. No tenderness.  No cyanosis, clubbing, or edema.  2+ distal pulses.  LYMPHATIC: No Axillary, Supraclavicular, or Inguinal Adenopathy.  GAD 7 : Generalized Anxiety Score 08/01/2020 04/01/2020  Nervous, Anxious, on Edge 2 2  Control/stop worrying 2 1  Worry too much - different things 1 1  Trouble relaxing 1 1  Restless 1 1  Easily annoyed or irritable 2 1  Afraid - awful might happen 0 0  Total GAD 7 Score 9 7  Anxiety Difficulty Not difficult at all Not difficult at all    Assessment:   Annual gynecologic examination 54 y.o.   Contraception: post menopausal  status   Normal BMI and Overweight   Problem List Items Addressed This Visit      Other   GAD (generalized anxiety disorder)   Relevant Medications   buPROPion (WELLBUTRIN XL) 300 MG 24 hr tablet   sertraline (ZOLOFT) 50 MG tablet   ALPRAZolam (XANAX) 0.5 MG tablet   traZODone (DESYREL) 50 MG tablet    Other Visit Diagnoses    Well woman exam    -  Primary   Relevant Orders   MM 3D SCREEN BREAST BILATERAL   Cytology - PAP  Sleep disorder       Screening mammogram, encounter for       Relevant Orders   MM 3D SCREEN BREAST BILATERAL   Screening for cervical cancer       Relevant Orders   Cytology - PAP      Plan:   Pap: Not needed  Mammogram: Ordered  Labs: Declined  Routine preventative health maintenance measures emphasized: Exercise/Diet/Weight control, Tobacco Warnings, Alcohol/Substance use risks and Stress Management; see AVS  Medications refilled, see chart  Return to Clinic - 1 Year   Serafina Royals, CNM  [facility], [facility]

## 2020-07-29 NOTE — Patient Instructions (Signed)
Alprazolam tablets What is this medicine? ALPRAZOLAM (al PRAY zoe lam) is a benzodiazepine. It is used to treat anxiety and panic attacks. This medicine may be used for other purposes; ask your health care provider or pharmacist if you have questions. COMMON BRAND NAME(S): Xanax What should I tell my health care provider before I take this medicine? They need to know if you have any of these conditions:  an alcohol or drug abuse problem  bipolar disorder, depression, psychosis or other mental health conditions  glaucoma  kidney or liver disease  lung or breathing disease  myasthenia gravis  Parkinson's disease  porphyria  seizures or a history of seizures  suicidal thoughts  an unusual or allergic reaction to alprazolam, other benzodiazepines, foods, dyes, or preservatives  pregnant or trying to get pregnant  breast-feeding How should I use this medicine? Take this medicine by mouth with a glass of water. Follow the directions on the prescription label. Take your medicine at regular intervals. Do not take it more often than directed. Do not stop taking except on your doctor's advice. A special MedGuide will be given to you by the pharmacist with each prescription and refill. Be sure to read this information carefully each time. Talk to your pediatrician regarding the use of this medicine in children. Special care may be needed. Overdosage: If you think you have taken too much of this medicine contact a poison control center or emergency room at once. NOTE: This medicine is only for you. Do not share this medicine with others. What if I miss a dose? If you miss a dose, take it as soon as you can. If it is almost time for your next dose, take only that dose. Do not take double or extra doses. What may interact with this medicine? Do not take this medicine with any of the following medications:  certain antiviral medicines for HIV or AIDS like delavirdine,  indinavir  certain medicines for fungal infections like ketoconazole and itraconazole  narcotic medicines for cough  sodium oxybate This medicine may also interact with the following medications:  alcohol  antihistamines for allergy, cough and cold  certain antibiotics like clarithromycin, erythromycin, isoniazid, rifampin, rifapentine, rifabutin, and troleandomycin  certain medicines for blood pressure, heart disease, irregular heart beat  certain medicines for depression, like amitriptyline, fluoxetine, sertraline  certain medicines for seizures like carbamazepine, oxcarbazepine, phenobarbital, phenytoin, primidone  cimetidine  cyclosporine  female hormones, like estrogens or progestins and birth control pills, patches, rings, or injections  general anesthetics like halothane, isoflurane, methoxyflurane, propofol  grapefruit juice  local anesthetics like lidocaine, pramoxine, tetracaine  medicines that relax muscles for surgery  narcotic medicines for pain  other antiviral medicines for HIV or AIDS  phenothiazines like chlorpromazine, mesoridazine, prochlorperazine, thioridazine This list may not describe all possible interactions. Give your health care provider a list of all the medicines, herbs, non-prescription drugs, or dietary supplements you use. Also tell them if you smoke, drink alcohol, or use illegal drugs. Some items may interact with your medicine. What should I watch for while using this medicine? Tell your doctor or health care professional if your symptoms do not start to get better or if they get worse. Do not stop taking except on your doctor's advice. You may develop a severe reaction. Your doctor will tell you how much medicine to take. You may get drowsy or dizzy. Do not drive, use machinery, or do anything that needs mental alertness until you know how this medicine affects  you. To reduce the risk of dizzy and fainting spells, do not stand or sit up  quickly, especially if you are an older patient. Alcohol may increase dizziness and drowsiness. Avoid alcoholic drinks. If you are taking another medicine that also causes drowsiness, you may have more side effects. Give your health care provider a list of all medicines you use. Your doctor will tell you how much medicine to take. Do not take more medicine than directed. Call emergency for help if you have problems breathing or unusual sleepiness. What side effects may I notice from receiving this medicine? Side effects that you should report to your doctor or health care professional as soon as possible:  allergic reactions like skin rash, itching or hives, swelling of the face, lips, or tongue  breathing problems  confusion  loss of balance or coordination  signs and symptoms of low blood pressure like dizziness; feeling faint or lightheaded, falls; unusually weak or tired  suicidal thoughts or other mood changes Side effects that usually do not require medical attention (report to your doctor or health care professional if they continue or are bothersome):  dizziness  dry mouth  nausea, vomiting  tiredness This list may not describe all possible side effects. Call your doctor for medical advice about side effects. You may report side effects to FDA at 1-800-FDA-1088. Where should I keep my medicine? Keep out of the reach of children. This medicine can be abused. Keep your medicine in a safe place to protect it from theft. Do not share this medicine with anyone. Selling or giving away this medicine is dangerous and against the law. Store at room temperature between 20 and 25 degrees C (68 and 77 degrees F). This medicine may cause accidental overdose and death if taken by other adults, children, or pets. Mix any unused medicine with a substance like cat litter or coffee grounds. Then throw the medicine away in a sealed container like a sealed bag or a coffee can with a lid. Do not use  the medicine after the expiration date. NOTE: This sheet is a summary. It may not cover all possible information. If you have questions about this medicine, talk to your doctor, pharmacist, or health care provider.  2020 Elsevier/Gold Standard (2015-08-05 13:47:25)   Sertraline tablets What is this medicine? SERTRALINE (SER tra leen) is used to treat depression. It may also be used to treat obsessive compulsive disorder, panic disorder, post-trauma stress, premenstrual dysphoric disorder (PMDD) or social anxiety. This medicine may be used for other purposes; ask your health care provider or pharmacist if you have questions. COMMON BRAND NAME(S): Zoloft What should I tell my health care provider before I take this medicine? They need to know if you have any of these conditions:  bleeding disorders  bipolar disorder or a family history of bipolar disorder  glaucoma  heart disease  high blood pressure  history of irregular heartbeat  history of low levels of calcium, magnesium, or potassium in the blood  if you often drink alcohol  liver disease  receiving electroconvulsive therapy  seizures  suicidal thoughts, plans, or attempt; a previous suicide attempt by you or a family member  take medicines that treat or prevent blood clots  thyroid disease  an unusual or allergic reaction to sertraline, other medicines, foods, dyes, or preservatives  pregnant or trying to get pregnant  breast-feeding How should I use this medicine? Take this medicine by mouth with a glass of water. Follow the directions on  the prescription label. You can take it with or without food. Take your medicine at regular intervals. Do not take your medicine more often than directed. Do not stop taking this medicine suddenly except upon the advice of your doctor. Stopping this medicine too quickly may cause serious side effects or your condition may worsen. A special MedGuide will be given to you by the  pharmacist with each prescription and refill. Be sure to read this information carefully each time. Talk to your pediatrician regarding the use of this medicine in children. While this drug may be prescribed for children as young as 7 years for selected conditions, precautions do apply. Overdosage: If you think you have taken too much of this medicine contact a poison control center or emergency room at once. NOTE: This medicine is only for you. Do not share this medicine with others. What if I miss a dose? If you miss a dose, take it as soon as you can. If it is almost time for your next dose, take only that dose. Do not take double or extra doses. What may interact with this medicine? Do not take this medicine with any of the following medications:  cisapride  dronedarone  linezolid  MAOIs like Carbex, Eldepryl, Marplan, Nardil, and Parnate  methylene blue (injected into a vein)  pimozide  thioridazine This medicine may also interact with the following medications:  alcohol  amphetamines  aspirin and aspirin-like medicines  certain medicines for depression, anxiety, or psychotic disturbances  certain medicines for fungal infections like ketoconazole, fluconazole, posaconazole, and itraconazole  certain medicines for irregular heart beat like flecainide, quinidine, propafenone  certain medicines for migraine headaches like almotriptan, eletriptan, frovatriptan, naratriptan, rizatriptan, sumatriptan, zolmitriptan  certain medicines for sleep  certain medicines for seizures like carbamazepine, valproic acid, phenytoin  certain medicines that treat or prevent blood clots like warfarin, enoxaparin, dalteparin  cimetidine  digoxin  diuretics  fentanyl  isoniazid  lithium  NSAIDs, medicines for pain and inflammation, like ibuprofen or naproxen  other medicines that prolong the QT interval (cause an abnormal heart rhythm) like  dofetilide  rasagiline  safinamide  supplements like St. John's wort, kava kava, valerian  tolbutamide  tramadol  tryptophan This list may not describe all possible interactions. Give your health care provider a list of all the medicines, herbs, non-prescription drugs, or dietary supplements you use. Also tell them if you smoke, drink alcohol, or use illegal drugs. Some items may interact with your medicine. What should I watch for while using this medicine? Tell your doctor if your symptoms do not get better or if they get worse. Visit your doctor or health care professional for regular checks on your progress. Because it may take several weeks to see the full effects of this medicine, it is important to continue your treatment as prescribed by your doctor. Patients and their families should watch out for new or worsening thoughts of suicide or depression. Also watch out for sudden changes in feelings such as feeling anxious, agitated, panicky, irritable, hostile, aggressive, impulsive, severely restless, overly excited and hyperactive, or not being able to sleep. If this happens, especially at the beginning of treatment or after a change in dose, call your health care professional. Dennis Bast may get drowsy or dizzy. Do not drive, use machinery, or do anything that needs mental alertness until you know how this medicine affects you. Do not stand or sit up quickly, especially if you are an older patient. This reduces the risk of dizzy  or fainting spells. Alcohol may interfere with the effect of this medicine. Avoid alcoholic drinks. Your mouth may get dry. Chewing sugarless gum or sucking hard candy, and drinking plenty of water may help. Contact your doctor if the problem does not go away or is severe. What side effects may I notice from receiving this medicine? Side effects that you should report to your doctor or health care professional as soon as possible:  allergic reactions like skin rash,  itching or hives, swelling of the face, lips, or tongue  anxious  black, tarry stools  changes in vision  confusion  elevated mood, decreased need for sleep, racing thoughts, impulsive behavior  eye pain  fast, irregular heartbeat  feeling faint or lightheaded, falls  feeling agitated, angry, or irritable  hallucination, loss of contact with reality  loss of balance or coordination  loss of memory  painful or prolonged erections  restlessness, pacing, inability to keep still  seizures  stiff muscles  suicidal thoughts or other mood changes  trouble sleeping  unusual bleeding or bruising  unusually weak or tired  vomiting Side effects that usually do not require medical attention (report to your doctor or health care professional if they continue or are bothersome):  change in appetite or weight  change in sex drive or performance  diarrhea  increased sweating  indigestion, nausea  tremors This list may not describe all possible side effects. Call your doctor for medical advice about side effects. You may report side effects to FDA at 1-800-FDA-1088. Where should I keep my medicine? Keep out of the reach of children. Store at room temperature between 15 and 30 degrees C (59 and 86 degrees F). Throw away any unused medicine after the expiration date. NOTE: This sheet is a summary. It may not cover all possible information. If you have questions about this medicine, talk to your doctor, pharmacist, or health care provider.  2020 Elsevier/Gold Standard (2018-10-29 10:09:27)    Bupropion tablets (Depression/Mood Disorders) What is this medicine? BUPROPION (byoo PROE pee on) is used to treat depression. This medicine may be used for other purposes; ask your health care provider or pharmacist if you have questions. COMMON BRAND NAME(S): Wellbutrin What should I tell my health care provider before I take this medicine? They need to know if you have any  of these conditions:  an eating disorder, such as anorexia or bulimia  bipolar disorder or psychosis  diabetes or high blood sugar, treated with medication  glaucoma  heart disease, previous heart attack, or irregular heart beat  head injury or brain tumor  high blood pressure  kidney or liver disease  seizures  suicidal thoughts or a previous suicide attempt  Tourette's syndrome  weight loss  an unusual or allergic reaction to bupropion, other medicines, foods, dyes, or preservatives  breast-feeding  pregnant or trying to become pregnant How should I use this medicine? Take this medicine by mouth with a glass of water. Follow the directions on the prescription label. You can take it with or without food. If it upsets your stomach, take it with food. Take your medicine at regular intervals. Do not take your medicine more often than directed. Do not stop taking this medicine suddenly except upon the advice of your doctor. Stopping this medicine too quickly may cause serious side effects or your condition may worsen. A special MedGuide will be given to you by the pharmacist with each prescription and refill. Be sure to read this information carefully each  time. Talk to your pediatrician regarding the use of this medicine in children. Special care may be needed. Overdosage: If you think you have taken too much of this medicine contact a poison control center or emergency room at once. NOTE: This medicine is only for you. Do not share this medicine with others. What if I miss a dose? If you miss a dose, take it as soon as you can. If it is less than four hours to your next dose, take only that dose and skip the missed dose. Do not take double or extra doses. What may interact with this medicine? Do not take this medicine with any of the following medications:  linezolid  MAOIs like Azilect, Carbex, Eldepryl, Marplan, Nardil, and Parnate  methylene blue (injected into a  vein)  other medicines that contain bupropion like Zyban This medicine may also interact with the following medications:  alcohol  certain medicines for anxiety or sleep  certain medicines for blood pressure like metoprolol, propranolol  certain medicines for depression or psychotic disturbances  certain medicines for HIV or AIDS like efavirenz, lopinavir, nelfinavir, ritonavir  certain medicines for irregular heart beat like propafenone, flecainide  certain medicines for Parkinson's disease like amantadine, levodopa  certain medicines for seizures like carbamazepine, phenytoin, phenobarbital  cimetidine  clopidogrel  cyclophosphamide  digoxin  furazolidone  isoniazid  nicotine  orphenadrine  procarbazine  steroid medicines like prednisone or cortisone  stimulant medicines for attention disorders, weight loss, or to stay awake  tamoxifen  theophylline  thiotepa  ticlopidine  tramadol  warfarin This list may not describe all possible interactions. Give your health care provider a list of all the medicines, herbs, non-prescription drugs, or dietary supplements you use. Also tell them if you smoke, drink alcohol, or use illegal drugs. Some items may interact with your medicine. What should I watch for while using this medicine? Tell your doctor if your symptoms do not get better or if they get worse. Visit your doctor or healthcare provider for regular checks on your progress. Because it may take several weeks to see the full effects of this medicine, it is important to continue your treatment as prescribed by your doctor. This medicine may cause serious skin reactions. They can happen weeks to months after starting the medicine. Contact your healthcare provider right away if you notice fevers or flu-like symptoms with a rash. The rash may be red or purple and then turn into blisters or peeling of the skin. Or, you might notice a red rash with swelling of the  face, lips or lymph nodes in your neck or under your arms. Patients and their families should watch out for new or worsening thoughts of suicide or depression. Also watch out for sudden changes in feelings such as feeling anxious, agitated, panicky, irritable, hostile, aggressive, impulsive, severely restless, overly excited and hyperactive, or not being able to sleep. If this happens, especially at the beginning of treatment or after a change in dose, call your healthcare provider. Avoid alcoholic drinks while taking this medicine. Drinking excessive alcoholic beverages, using sleeping or anxiety medicines, or quickly stopping the use of these agents while taking this medicine may increase your risk for a seizure. Do not drive or use heavy machinery until you know how this medicine affects you. This medicine can impair your ability to perform these tasks. Do not take this medicine close to bedtime. It may prevent you from sleeping. Your mouth may get dry. Chewing sugarless gum or sucking hard  candy, and drinking plenty of water may help. Contact your doctor if the problem does not go away or is severe. What side effects may I notice from receiving this medicine? Side effects that you should report to your doctor or health care professional as soon as possible:  allergic reactions like skin rash, itching or hives, swelling of the face, lips, or tongue  breathing problems  changes in vision  confusion  elevated mood, decreased need for sleep, racing thoughts, impulsive behavior  fast or irregular heartbeat  hallucinations, loss of contact with reality  increased blood pressure  rash, fever, and swollen lymph nodes  redness, blistering, peeling, or loosening of the skin, including inside the mouth  seizures  suicidal thoughts or other mood changes  unusually weak or tired  vomiting Side effects that usually do not require medical attention (report to your doctor or health care  professional if they continue or are bothersome):  constipation  headache  loss of appetite  nausea  tremors  weight loss This list may not describe all possible side effects. Call your doctor for medical advice about side effects. You may report side effects to FDA at 1-800-FDA-1088. Where should I keep my medicine? Keep out of the reach of children. Store at room temperature between 20 and 25 degrees C (68 and 77 degrees F), away from direct sunlight and moisture. Keep tightly closed. Throw away any unused medicine after the expiration date. NOTE: This sheet is a summary. It may not cover all possible information. If you have questions about this medicine, talk to your doctor, pharmacist, or health care provider.  2020 Elsevier/Gold Standard (2019-01-30 14:02:47)   Trazodone tablets What is this medicine? TRAZODONE (TRAZ oh done) is used to treat depression. This medicine may be used for other purposes; ask your health care provider or pharmacist if you have questions. COMMON BRAND NAME(S): Desyrel What should I tell my health care provider before I take this medicine? They need to know if you have any of these conditions:  attempted suicide or thinking about it  bipolar disorder  bleeding problems  glaucoma  heart disease, or previous heart attack  irregular heart beat  kidney or liver disease  low levels of sodium in the blood  an unusual or allergic reaction to trazodone, other medicines, foods, dyes or preservatives  pregnant or trying to get pregnant  breast-feeding How should I use this medicine? Take this medicine by mouth with a glass of water. Follow the directions on the prescription label. Take this medicine shortly after a meal or a light snack. Take your medicine at regular intervals. Do not take your medicine more often than directed. Do not stop taking this medicine suddenly except upon the advice of your doctor. Stopping this medicine too quickly  may cause serious side effects or your condition may worsen. A special MedGuide will be given to you by the pharmacist with each prescription and refill. Be sure to read this information carefully each time. Talk to your pediatrician regarding the use of this medicine in children. Special care may be needed. Overdosage: If you think you have taken too much of this medicine contact a poison control center or emergency room at once. NOTE: This medicine is only for you. Do not share this medicine with others. What if I miss a dose? If you miss a dose, take it as soon as you can. If it is almost time for your next dose, take only that dose. Do not take  double or extra doses. What may interact with this medicine? Do not take this medicine with any of the following medications:  certain medicines for fungal infections like fluconazole, itraconazole, ketoconazole, posaconazole, voriconazole  cisapride  dronedarone  linezolid  MAOIs like Carbex, Eldepryl, Marplan, Nardil, and Parnate  mesoridazine  methylene blue (injected into a vein)  pimozide  saquinavir  thioridazine This medicine may also interact with the following medications:  alcohol  antiviral medicines for HIV or AIDS  aspirin and aspirin-like medicines  barbiturates like phenobarbital  certain medicines for blood pressure, heart disease, irregular heart beat  certain medicines for depression, anxiety, or psychotic disturbances  certain medicines for migraine headache like almotriptan, eletriptan, frovatriptan, naratriptan, rizatriptan, sumatriptan, zolmitriptan  certain medicines for seizures like carbamazepine and phenytoin  certain medicines for sleep  certain medicines that treat or prevent blood clots like dalteparin, enoxaparin, warfarin  digoxin  fentanyl  lithium  NSAIDS, medicines for pain and inflammation, like ibuprofen or naproxen  other medicines that prolong the QT interval (cause an  abnormal heart rhythm) like dofetilide  rasagiline  supplements like St. John's wort, kava kava, valerian  tramadol  tryptophan This list may not describe all possible interactions. Give your health care provider a list of all the medicines, herbs, non-prescription drugs, or dietary supplements you use. Also tell them if you smoke, drink alcohol, or use illegal drugs. Some items may interact with your medicine. What should I watch for while using this medicine? Tell your doctor if your symptoms do not get better or if they get worse. Visit your doctor or health care professional for regular checks on your progress. Because it may take several weeks to see the full effects of this medicine, it is important to continue your treatment as prescribed by your doctor. Patients and their families should watch out for new or worsening thoughts of suicide or depression. Also watch out for sudden changes in feelings such as feeling anxious, agitated, panicky, irritable, hostile, aggressive, impulsive, severely restless, overly excited and hyperactive, or not being able to sleep. If this happens, especially at the beginning of treatment or after a change in dose, call your health care professional. Dennis Bast may get drowsy or dizzy. Do not drive, use machinery, or do anything that needs mental alertness until you know how this medicine affects you. Do not stand or sit up quickly, especially if you are an older patient. This reduces the risk of dizzy or fainting spells. Alcohol may interfere with the effect of this medicine. Avoid alcoholic drinks. This medicine may cause dry eyes and blurred vision. If you wear contact lenses you may feel some discomfort. Lubricating drops may help. See your eye doctor if the problem does not go away or is severe. Your mouth may get dry. Chewing sugarless gum, sucking hard candy and drinking plenty of water may help. Contact your doctor if the problem does not go away or is  severe. What side effects may I notice from receiving this medicine? Side effects that you should report to your doctor or health care professional as soon as possible:  allergic reactions like skin rash, itching or hives, swelling of the face, lips, or tongue  elevated mood, decreased need for sleep, racing thoughts, impulsive behavior  confusion  fast, irregular heartbeat  feeling faint or lightheaded, falls  feeling agitated, angry, or irritable  loss of balance or coordination  painful or prolonged erections  restlessness, pacing, inability to keep still  suicidal thoughts or other mood  changes  tremors  trouble sleeping  seizures  unusual bleeding or bruising Side effects that usually do not require medical attention (report to your doctor or health care professional if they continue or are bothersome):  change in sex drive or performance  change in appetite or weight  constipation  headache  muscle aches or pains  nausea This list may not describe all possible side effects. Call your doctor for medical advice about side effects. You may report side effects to FDA at 1-800-FDA-1088. Where should I keep my medicine? Keep out of the reach of children. Store at room temperature between 15 and 30 degrees C (59 to 86 degrees F). Protect from light. Keep container tightly closed. Throw away any unused medicine after the expiration date. NOTE: This sheet is a summary. It may not cover all possible information. If you have questions about this medicine, talk to your doctor, pharmacist, or health care provider.  2020 Elsevier/Gold Standard (2018-10-29 11:46:46)   Preventive Care 25-14 Years Old, Female Preventive care refers to visits with your health care provider and lifestyle choices that can promote health and wellness. This includes:  A yearly physical exam. This may also be called an annual well check.  Regular dental visits and eye  exams.  Immunizations.  Screening for certain conditions.  Healthy lifestyle choices, such as eating a healthy diet, getting regular exercise, not using drugs or products that contain nicotine and tobacco, and limiting alcohol use. What can I expect for my preventive care visit? Physical exam Your health care provider will check your:  Height and weight. This may be used to calculate body mass index (BMI), which tells if you are at a healthy weight.  Heart rate and blood pressure.  Skin for abnormal spots. Counseling Your health care provider may ask you questions about your:  Alcohol, tobacco, and drug use.  Emotional well-being.  Home and relationship well-being.  Sexual activity.  Eating habits.  Work and work Statistician.  Method of birth control.  Menstrual cycle.  Pregnancy history. What immunizations do I need?  Influenza (flu) vaccine  This is recommended every year. Tetanus, diphtheria, and pertussis (Tdap) vaccine  You may need a Td booster every 10 years. Varicella (chickenpox) vaccine  You may need this if you have not been vaccinated. Zoster (shingles) vaccine  You may need this after age 48. Measles, mumps, and rubella (MMR) vaccine  You may need at least one dose of MMR if you were born in 1957 or later. You may also need a second dose. Pneumococcal conjugate (PCV13) vaccine  You may need this if you have certain conditions and were not previously vaccinated. Pneumococcal polysaccharide (PPSV23) vaccine  You may need one or two doses if you smoke cigarettes or if you have certain conditions. Meningococcal conjugate (MenACWY) vaccine  You may need this if you have certain conditions. Hepatitis A vaccine  You may need this if you have certain conditions or if you travel or work in places where you may be exposed to hepatitis A. Hepatitis B vaccine  You may need this if you have certain conditions or if you travel or work in places where  you may be exposed to hepatitis B. Haemophilus influenzae type b (Hib) vaccine  You may need this if you have certain conditions. Human papillomavirus (HPV) vaccine  If recommended by your health care provider, you may need three doses over 6 months. You may receive vaccines as individual doses or as more than one vaccine  together in one shot (combination vaccines). Talk with your health care provider about the risks and benefits of combination vaccines. What tests do I need? Blood tests  Lipid and cholesterol levels. These may be checked every 5 years, or more frequently if you are over 27 years old.  Hepatitis C test.  Hepatitis B test. Screening  Lung cancer screening. You may have this screening every year starting at age 76 if you have a 30-pack-year history of smoking and currently smoke or have quit within the past 15 years.  Colorectal cancer screening. All adults should have this screening starting at age 74 and continuing until age 43. Your health care provider may recommend screening at age 32 if you are at increased risk. You will have tests every 1-10 years, depending on your results and the type of screening test.  Diabetes screening. This is done by checking your blood sugar (glucose) after you have not eaten for a while (fasting). You may have this done every 1-3 years.  Mammogram. This may be done every 1-2 years. Talk with your health care provider about when you should start having regular mammograms. This may depend on whether you have a family history of breast cancer.  BRCA-related cancer screening. This may be done if you have a family history of breast, ovarian, tubal, or peritoneal cancers.  Pelvic exam and Pap test. This may be done every 3 years starting at age 35. Starting at age 82, this may be done every 5 years if you have a Pap test in combination with an HPV test. Other tests  Sexually transmitted disease (STD) testing.  Bone density scan. This is  done to screen for osteoporosis. You may have this scan if you are at high risk for osteoporosis. Follow these instructions at home: Eating and drinking  Eat a diet that includes fresh fruits and vegetables, whole grains, lean protein, and low-fat dairy.  Take vitamin and mineral supplements as recommended by your health care provider.  Do not drink alcohol if: ? Your health care provider tells you not to drink. ? You are pregnant, may be pregnant, or are planning to become pregnant.  If you drink alcohol: ? Limit how much you have to 0-1 drink a day. ? Be aware of how much alcohol is in your drink. In the [place] one drink equals one 12 oz bottle of beer (355 mL), one 5 oz glass of wine (148 mL), or one 1 oz glass of hard liquor (44 mL). Lifestyle  Take daily care of your teeth and gums.  Stay active. Exercise for at least 30 minutes on 5 or more days each week.  Do not use any products that contain nicotine or tobacco, such as cigarettes, e-cigarettes, and chewing tobacco. If you need help quitting, ask your health care provider.  If you are sexually active, practice safe sex. Use a condom or other form of birth control (contraception) in order to prevent pregnancy and STIs (sexually transmitted infections).  If told by your health care provider, take low-dose aspirin daily starting at age 57. What's next?  Visit your health care provider once a year for a well check visit.  Ask your health care provider how often you should have your eyes and teeth checked.  Stay up to date on all vaccines. This information is not intended to replace advice given to you by your health care provider. Make sure you discuss any questions you have with your health care provider. Document Revised: 07/18/2018  Document Reviewed: 07/18/2018 Elsevier Patient Education  2020 Elsevier Inc.  

## 2020-08-02 LAB — CYTOLOGY - PAP
Comment: NEGATIVE
Diagnosis: NEGATIVE
High risk HPV: NEGATIVE

## 2021-02-01 ENCOUNTER — Other Ambulatory Visit: Payer: Self-pay

## 2021-02-01 ENCOUNTER — Ambulatory Visit
Admission: RE | Admit: 2021-02-01 | Discharge: 2021-02-01 | Disposition: A | Discharge status: Home / Self Care | Payer: Managed Care, Other (non HMO) | Source: Ambulatory Visit | Attending: Certified Nurse Midwife | Admitting: Certified Nurse Midwife

## 2021-02-01 DIAGNOSIS — Z1231 Encounter for screening mammogram for malignant neoplasm of breast: Secondary | ICD-10-CM | POA: Insufficient documentation

## 2021-02-01 DIAGNOSIS — Z01419 Encounter for gynecological examination (general) (routine) without abnormal findings: Secondary | ICD-10-CM

## 2021-03-05 ENCOUNTER — Other Ambulatory Visit: Payer: Self-pay | Admitting: Certified Nurse Midwife

## 2021-05-30 ENCOUNTER — Other Ambulatory Visit: Payer: Self-pay | Admitting: Certified Nurse Midwife

## 2021-05-30 DIAGNOSIS — F411 Generalized anxiety disorder: Secondary | ICD-10-CM

## 2021-05-30 DIAGNOSIS — Z79899 Other long term (current) drug therapy: Secondary | ICD-10-CM

## 2021-05-30 MED ORDER — ALPRAZOLAM 0.5 MG PO TABS
0.5000 mg | ORAL_TABLET | Freq: Every day | ORAL | 1 refills | Status: AC | PRN
Start: 2021-05-30 — End: ?

## 2021-05-30 NOTE — Progress Notes (Signed)
GAD-7: 14, see MyChart.   Rx Xanax, see orders.    Serafina Royals, CNM [facility], [facility] 05/30/21 10:35 AM

## 2021-07-19 ENCOUNTER — Other Ambulatory Visit: Payer: Managed Care, Other (non HMO)

## 2021-07-19 VITALS — BP 116/70 | HR 70 | Temp 97.0°F | Resp 16 | Ht 62.0 in | Wt 136.0 lb

## 2021-07-19 DIAGNOSIS — Z008 Encounter for other general examination: Secondary | ICD-10-CM | POA: Diagnosis not present

## 2021-07-19 DIAGNOSIS — Z0000 Encounter for general adult medical examination without abnormal findings: Secondary | ICD-10-CM | POA: Diagnosis not present

## 2021-07-20 LAB — LIPID PANEL
Chol/HDL Ratio: 5.9 ratio — ABNORMAL HIGH (ref 0.0–4.4)
Cholesterol, Total: 284 mg/dL — ABNORMAL HIGH (ref 100–199)
HDL: 48 mg/dL (ref >39)
LDL Chol Calc (NIH): 200 mg/dL — ABNORMAL HIGH (ref 0–99)
Triglycerides: 189 mg/dL — ABNORMAL HIGH (ref 0–149)
VLDL Cholesterol Cal: 36 mg/dL (ref 5–40)

## 2021-07-20 LAB — GLUCOSE, RANDOM: Glucose: 97 mg/dL (ref 65–99)

## 2021-08-01 ENCOUNTER — Encounter: Payer: Managed Care, Other (non HMO) | Admitting: Certified Nurse Midwife

## 2021-08-08 ENCOUNTER — Encounter: Payer: Self-pay | Admitting: Certified Nurse Midwife

## 2021-08-08 ENCOUNTER — Other Ambulatory Visit: Payer: Self-pay

## 2021-08-08 ENCOUNTER — Ambulatory Visit (INDEPENDENT_AMBULATORY_CARE_PROVIDER_SITE_OTHER): Payer: Managed Care, Other (non HMO) | Admitting: Certified Nurse Midwife

## 2021-08-08 VITALS — BP 147/84 | HR 81 | Ht 62.0 in | Wt 137.5 lb

## 2021-08-08 DIAGNOSIS — F411 Generalized anxiety disorder: Secondary | ICD-10-CM

## 2021-08-08 DIAGNOSIS — Z01419 Encounter for gynecological examination (general) (routine) without abnormal findings: Secondary | ICD-10-CM

## 2021-08-08 MED ORDER — LOSARTAN POTASSIUM-HCTZ 50-12.5 MG PO TABS
1.0000 | ORAL_TABLET | Freq: Every day | ORAL | 4 refills | Status: DC
Start: 2021-08-08 — End: 2022-09-12

## 2021-08-08 MED ORDER — TRAZODONE HCL 50 MG PO TABS: 50.0000 mg | ORAL_TABLET | Freq: Every day | ORAL | 3 refills | Status: DC

## 2021-08-08 MED ORDER — BUPROPION HCL ER (XL) 300 MG PO TB24
300.0000 mg | ORAL_TABLET | Freq: Every day | ORAL | 4 refills | Status: DC
Start: 2021-08-08 — End: 2022-09-12

## 2021-08-08 MED ORDER — SERTRALINE HCL 50 MG PO TABS: 50.0000 mg | ORAL_TABLET | Freq: Every day | ORAL | 4 refills | Status: DC

## 2021-08-08 NOTE — Progress Notes (Signed)
GYNECOLOGY ANNUAL PREVENTATIVE CARE ENCOUNTER NOTE  History:     Meghan Sandoval is a 55 y.o. G2P0 female here for a routine annual gynecologic exam.  Current complaints: none.   Denies abnormal vaginal bleeding, discharge, pelvic pain, problems with intercourse or other gynecologic concerns.     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 Patient is postmenopausal. Contraception: post menopausal status Last Pap: 07/29/2020. Results were: normal with negative HPV Last mammogram: 02/01/2021. Results were: normal  Obstetric History OB History  Gravida Para Term Preterm AB Living  2         2  SAB IAB Ectopic Multiple Live Births          2    # Outcome Date GA Lbr Len/2nd Weight Sex Delivery Anes PTL Lv  2 Gravida 1996    F Vag-Spont   LIV  1 Gravida 1992    F Vag-Spont   LIV    Past Medical History:  Diagnosis Date   Anxiety    Carpal tunnel syndrome of right wrist    Hypercholesteremia    Hypertension     Past Surgical History:  Procedure Laterality Date   COLONOSCOPY WITH PROPOFOL N/A 04/27/2017   Procedure: COLONOSCOPY WITH PROPOFOL;  Surgeon: Midge Minium, MD;  Location: [facility];  Service: Endoscopy;  Laterality: N/A;   no past surgery     TONSILLECTOMY     age 55   WRIST SURGERY  06/2019   carpal tunnel    Current Outpatient Medications on File Prior to Visit  Medication Sig Dispense Refill   ALPRAZolam (XANAX) 0.5 MG tablet Take 1 tablet (0.5 mg total) by mouth daily as needed for anxiety. 30 tablet 1   buPROPion (WELLBUTRIN XL) 300 MG 24 hr tablet Take 1 tablet (300 mg total) by mouth daily. 90 tablet 4   losartan-hydrochlorothiazide (HYZAAR) 50-12.5 MG tablet Take 1 tablet by mouth daily. 90 tablet 4   sertraline (ZOLOFT) 50 MG tablet Take 1 tablet (50 mg  total) by mouth daily. 90 tablet 4   traZODone (DESYREL) 50 MG tablet TAKE 1 TABLET BY MOUTH EVERYDAY AT BEDTIME 90 tablet 1   No current facility-administered medications on file prior to visit.    No Known Allergies  Social History:  reports that she has never smoked. She has never used smokeless tobacco. She reports current alcohol use. She reports that she does not use drugs.  Family History  Problem Relation Age of Onset   Other Mother        gastrointestinal   Hypertension Mother    Migraines Daughter    Migraines Daughter    Breast cancer Neg Hx     The following portions of the patient's history were reviewed and updated as appropriate: allergies, current medications, past family history, past medical history, past social history, past surgical history and problem list.  Review of Systems Pertinent items noted in HPI and remainder of comprehensive ROS otherwise negative.  Physical Exam:  BP (!) 147/84   Pulse 81   Ht 5\' 2"  (1.575 m)   Wt 137 lb 8 oz (62.4 kg)   BMI 25.15 kg/m  CONSTITUTIONAL: Well-developed, well-nourished female in no acute distress.  HENT:  Normocephalic, atraumatic, External right and left ear normal. Oropharynx is clear and moist EYES:  Conjunctivae and EOM are normal. Pupils are equal, round, and reactive to light. No scleral icterus.  NECK: Normal range of motion, supple, no masses.  Normal thyroid.  SKIN: Skin is warm and dry. No rash noted. Not diaphoretic. No erythema. No pallor. MUSCULOSKELETAL: Normal range of motion. No tenderness.  No cyanosis, clubbing, or edema.  2+ distal pulses. NEUROLOGIC: Alert and oriented to person, place, and time. Normal reflexes, muscle tone coordination.  PSYCHIATRIC: Normal mood and affect. Normal behavior. Normal judgment and thought content. CARDIOVASCULAR: Normal heart rate noted, regular rhythm RESPIRATORY: Clear to auscultation bilaterally. Effort and breath sounds normal, no problems with respiration  noted. BREASTS: Symmetric in size. No masses, tenderness, skin changes, nipple drainage, or lymphadenopathy bilaterally.  ABDOMEN: Soft, no distention noted.  No tenderness, rebound or guarding.  PELVIC: Normal appearing external genitalia and urethral meatus; normal appearing vaginal mucosa and cervix.  No abnormal discharge noted.  Pap smear not done.  Normal uterine size, no other palpable masses, no uterine or adnexal tenderness.  .   Assessment and Plan:    1. Well woman exam with routine gynecological exam   Pap: not due Mammogram :ordered  Labs: declines Refills: Zoloft, wellbutrin, losartan trazadone Referral: none  Routine preventative health maintenance measures emphasized. Please refer to After Visit Summary for other counseling recommendations.      Doreene Burke, CNM [facility],  [facility]

## 2022-03-06 ENCOUNTER — Other Ambulatory Visit: Payer: Self-pay | Admitting: Certified Nurse Midwife

## 2022-03-06 DIAGNOSIS — Z1231 Encounter for screening mammogram for malignant neoplasm of breast: Secondary | ICD-10-CM

## 2022-04-12 ENCOUNTER — Ambulatory Visit
Admission: RE | Admit: 2022-04-12 | Discharge: 2022-04-12 | Disposition: A | Discharge status: Home / Self Care | Payer: Managed Care, Other (non HMO) | Source: Ambulatory Visit | Attending: Certified Nurse Midwife | Admitting: Certified Nurse Midwife

## 2022-04-12 DIAGNOSIS — Z1231 Encounter for screening mammogram for malignant neoplasm of breast: Secondary | ICD-10-CM | POA: Insufficient documentation

## 2022-08-09 ENCOUNTER — Encounter: Payer: Managed Care, Other (non HMO) | Admitting: Certified Nurse Midwife

## 2022-08-14 ENCOUNTER — Other Ambulatory Visit: Payer: Self-pay | Admitting: Certified Nurse Midwife

## 2022-08-15 ENCOUNTER — Other Ambulatory Visit: Payer: Self-pay | Admitting: Certified Nurse Midwife

## 2022-08-15 DIAGNOSIS — F411 Generalized anxiety disorder: Secondary | ICD-10-CM

## 2022-08-24 ENCOUNTER — Telehealth: Payer: Self-pay

## 2022-08-24 NOTE — Telephone Encounter (Signed)
Pt left msg on triage requesting RF's on medications. Called her back to get clarification on what Rx's she needed Rfs on and she said "nevermind I think I have enough until I see her".

## 2022-09-12 ENCOUNTER — Ambulatory Visit: Payer: Managed Care, Other (non HMO) | Admitting: Certified Nurse Midwife

## 2022-09-12 ENCOUNTER — Encounter: Payer: Self-pay | Admitting: Certified Nurse Midwife

## 2022-09-12 VITALS — BP 132/93 | HR 79 | Resp 16 | Ht 62.0 in | Wt 135.6 lb

## 2022-09-12 DIAGNOSIS — Z23 Encounter for immunization: Secondary | ICD-10-CM | POA: Diagnosis not present

## 2022-09-12 DIAGNOSIS — G4700 Insomnia, unspecified: Secondary | ICD-10-CM | POA: Insufficient documentation

## 2022-09-12 DIAGNOSIS — F411 Generalized anxiety disorder: Secondary | ICD-10-CM

## 2022-09-12 DIAGNOSIS — Z01419 Encounter for gynecological examination (general) (routine) without abnormal findings: Secondary | ICD-10-CM

## 2022-09-12 DIAGNOSIS — Z1231 Encounter for screening mammogram for malignant neoplasm of breast: Secondary | ICD-10-CM

## 2022-09-12 MED ORDER — BUPROPION HCL ER (XL) 300 MG PO TB24: 300.0000 mg | ORAL_TABLET | Freq: Every day | ORAL | 3 refills | Status: DC

## 2022-09-12 MED ORDER — TRAZODONE HCL 100 MG PO TABS: 100.0000 mg | ORAL_TABLET | Freq: Every evening | ORAL | 3 refills | Status: DC | PRN

## 2022-09-12 MED ORDER — LOSARTAN POTASSIUM-HCTZ 50-12.5 MG PO TABS: 1.0000 | ORAL_TABLET | Freq: Every day | ORAL | 3 refills | Status: DC

## 2022-09-12 MED ORDER — SERTRALINE HCL 50 MG PO TABS: 50.0000 mg | ORAL_TABLET | Freq: Every day | ORAL | 3 refills | Status: DC

## 2022-09-12 NOTE — Progress Notes (Signed)
GYNECOLOGY ANNUAL PREVENTATIVE CARE ENCOUNTER NOTE  History:     Meghan Sandoval is a 56 y.o. G2P0 female here for a routine annual gynecologic exam.  Current complaints: not sleeping well with current dose of tazadone request to increase dose.   Denies abnormal vaginal bleeding, discharge, pelvic pain, problems with intercourse or other gynecologic concerns.     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 Patient is postmenopausal. Contraception: post menopausal status Last Pap: 07/29/2020. Results were: normal with negative HPV Last mammogram: 04/12/2022. Results were: normal  Obstetric History OB History  Gravida Para Term Preterm AB Living  2         2  SAB IAB Ectopic Multiple Live Births          2    # Outcome Date GA Lbr Len/2nd Weight Sex Delivery Anes PTL Lv  2 Gravida 1996    F Vag-Spont   LIV  1 Gravida 1992    F Vag-Spont   LIV    Past Medical History:  Diagnosis Date   Anxiety    Carpal tunnel syndrome of right wrist    Hypercholesteremia    Hypertension     Past Surgical History:  Procedure Laterality Date   COLONOSCOPY WITH PROPOFOL N/A 04/27/2017   Procedure: COLONOSCOPY WITH PROPOFOL;  Surgeon: Lucilla Lame, MD;  Location: [facility];  Service: Endoscopy;  Laterality: N/A;   no past surgery     TONSILLECTOMY     age 32   WRIST SURGERY  06/2019   carpal tunnel    Current Outpatient Medications on File Prior to Visit  Medication Sig Dispense Refill   ALPRAZolam (XANAX) 0.5 MG tablet Take 1 tablet (0.5 mg total) by mouth daily as needed for anxiety. 30 tablet 1   buPROPion (WELLBUTRIN XL) 300 MG 24 hr tablet Take 1 tablet (300 mg total) by mouth daily. 90 tablet 4   losartan-hydrochlorothiazide (HYZAAR) 50-12.5 MG tablet Take 1 tablet by mouth daily. 90 tablet 4   sertraline (ZOLOFT) 50 MG tablet  Take 1 tablet (50 mg total) by mouth daily. 90 tablet 4   traZODone (DESYREL) 50 MG tablet Take 1 tablet (50 mg total) by mouth at bedtime. 90 tablet 3   No current facility-administered medications on file prior to visit.    No Known Allergies  Social History:  reports that she has never smoked. She has never used smokeless tobacco. She reports current alcohol use. She reports that she does not use drugs.  Family History  Problem Relation Age of Onset   Other Mother        gastrointestinal   Hypertension Mother    Migraines Daughter    Migraines Daughter    Breast cancer Neg Hx     The following portions of the patient's history were reviewed and updated as appropriate: allergies, current medications, past family history, past medical history, past social history, past surgical history and problem list.  Review of Systems Pertinent items noted in HPI and remainder of comprehensive ROS otherwise negative.  Physical Exam:  BP (!) 132/93   Pulse 79   Resp 16   Ht 5\' 2"  (1.575 m)   Wt 135 lb 9.6 oz (61.5 kg)   SpO2 97%   BMI 24.80 kg/m  CONSTITUTIONAL: Well-developed, well-nourished female in no acute distress.  HENT:  Normocephalic, atraumatic, External right and  left ear normal. Oropharynx is clear and moist EYES: Conjunctivae and EOM are normal. Pupils are equal, round, and reactive to light. No scleral icterus.  NECK: Normal range of motion, supple, no masses.  Normal thyroid.  SKIN: Skin is warm and dry. No rash noted. Not diaphoretic. No erythema. No pallor. MUSCULOSKELETAL: Normal range of motion. No tenderness.  No cyanosis, clubbing, or edema.  2+ distal pulses. NEUROLOGIC: Alert and oriented to person, place, and time. Normal reflexes, muscle tone coordination.  PSYCHIATRIC: Normal mood and affect. Normal behavior. Normal judgment and thought content. CARDIOVASCULAR: Normal heart rate noted, regular rhythm RESPIRATORY: Clear to auscultation bilaterally. Effort and  breath sounds normal, no problems with respiration noted. BREASTS: Symmetric in size. No masses, tenderness, skin changes, nipple drainage, or lymphadenopathy bilaterally.  ABDOMEN: Soft, no distention noted.  No tenderness, rebound or guarding.  PELVIC: Normal appearing external genitalia and urethral meatus; normal appearing vaginal mucosa and cervix.  No abnormal discharge noted.  Pap smear not due.  Normal uterine size, no other palpable masses, no uterine or adnexal tenderness.  .   Assessment and Plan:    1. Need for influenza vaccination  - Flu Vaccine QUAD 27mo+IM (Fluarix, Fluzone & Alfiuria Quad PF)  2. Women's annual routine gynecological examination  Pap: not due  Mammogram : ordered Labs: declines  Refills: trazodone, Wellbutrin, Losartan, Zoloft  Referral: none Routine preventative health maintenance measures emphasized. Please refer to After Visit Summary for other counseling recommendations.      Doreene Burke, CNM [facility],  [facility]

## 2022-09-13 ENCOUNTER — Other Ambulatory Visit: Payer: Self-pay | Admitting: Certified Nurse Midwife

## 2022-11-17 ENCOUNTER — Encounter: Payer: Self-pay | Admitting: Dermatology

## 2023-02-20 ENCOUNTER — Ambulatory Visit: Payer: Managed Care, Other (non HMO) | Admitting: Dermatology

## 2023-04-17 ENCOUNTER — Ambulatory Visit
Admission: RE | Admit: 2023-04-17 | Discharge: 2023-04-17 | Disposition: A | Discharge status: Home / Self Care | Payer: Managed Care, Other (non HMO) | Source: Ambulatory Visit | Attending: Certified Nurse Midwife | Admitting: Certified Nurse Midwife

## 2023-04-17 DIAGNOSIS — Z1231 Encounter for screening mammogram for malignant neoplasm of breast: Secondary | ICD-10-CM | POA: Diagnosis present

## 2023-04-17 DIAGNOSIS — Z01419 Encounter for gynecological examination (general) (routine) without abnormal findings: Secondary | ICD-10-CM | POA: Diagnosis present

## 2023-09-11 ENCOUNTER — Other Ambulatory Visit: Payer: Self-pay | Admitting: Certified Nurse Midwife

## 2023-09-11 DIAGNOSIS — F411 Generalized anxiety disorder: Secondary | ICD-10-CM

## 2023-09-20 ENCOUNTER — Other Ambulatory Visit: Payer: Self-pay

## 2023-09-20 ENCOUNTER — Ambulatory Visit: Payer: Managed Care, Other (non HMO)

## 2023-09-20 VITALS — BP 136/78 | HR 85 | Ht 62.0 in | Wt 134.6 lb

## 2023-09-20 DIAGNOSIS — Z23 Encounter for immunization: Secondary | ICD-10-CM

## 2023-09-20 DIAGNOSIS — N898 Other specified noninflammatory disorders of vagina: Secondary | ICD-10-CM | POA: Insufficient documentation

## 2023-09-20 DIAGNOSIS — Z0000 Encounter for general adult medical examination without abnormal findings: Secondary | ICD-10-CM

## 2023-09-20 DIAGNOSIS — Z01419 Encounter for gynecological examination (general) (routine) without abnormal findings: Secondary | ICD-10-CM

## 2023-09-20 MED ORDER — ESTRADIOL 0.1 MG/GM VA CREA: 0.2500 | TOPICAL_CREAM | Freq: Every day | VAGINAL | 6 refills | Status: AC

## 2023-09-20 NOTE — Addendum Note (Signed)
Addended by: Autumn Messing on: 09/20/2023 02:50 PM   Modules accepted: Orders

## 2023-09-20 NOTE — Progress Notes (Addendum)
Outpatient Gynecology Note: Annual Visit  Assessment/Plan:    Meghan Sandoval is a 57 y.o. female G2P0 with normal well-woman gynecologic exam.   Well woman exam - Reviewed health maintenance topics as documented below. - Most recent pap smear in 2021, repeat cervical cancer screening due 2026  per ASCCP guidelines. Will defer pap smear today with shared decision-making. - Regular lab work completed with county health screening.  - Mammogram UTD in 2024, BIRADS I. Order placed for screening in 2025. - STI screening declined.    Vaginal dryness - Reviewed changes in hormones that can impact vulvovaginal health.  - Rx for topical Estrace provided. Instructions for use reviewed.      Risk factors identified in Subjective to review: none Orders Placed This Encounter  Procedures   MM 3D SCREENING MAMMOGRAM BILATERAL BREAST    Standing Status:   Future    Standing Expiration Date:   09/19/2024    Order Specific Question:   Reason for Exam (SYMPTOM  OR DIAGNOSIS REQUIRED)    Answer:   routine screening    Order Specific Question:   Is the patient pregnant?    Answer:   No    Order Specific Question:   Preferred imaging location?    Answer:   [facility]   Flu vaccine trivalent PF, 6mos and older(Flulaval,Afluria,Fluarix,Fluzone)   Current Outpatient Medications  Medication Instructions   ALPRAZolam (XANAX) 0.5 mg, Oral, Daily PRN   buPROPion (WELLBUTRIN XL) 300 mg, Oral, Daily   estradiol (ESTRACE) 0.1 MG/GM vaginal cream 0.25 Applicatorfuls, Vaginal, Daily at bedtime   losartan-hydrochlorothiazide (HYZAAR) 50-12.5 MG tablet 1 tablet, Oral, Daily   sertraline (ZOLOFT) 50 mg, Oral, Daily   traZODone (DESYREL) 100 mg, Oral, At bedtime PRN, for sleep    Return in about 1 year (around 09/19/2024) for Annual physical.    Subjective:    Meghan Sandoval is a 57 y.o. female G2P0 who presents for annual wellness visit.   Occupation IT consultant.    Lives with partner.     CONCERNS? none  Well Woman Visit:  GYN HISTORY:  No LMP recorded. Patient is postmenopausal.     Menstrual History: OB History     Gravida  2   Para      Term      Preterm      AB      Living  2      SAB      IAB      Ectopic      Multiple      Live Births  2           Menarche age: 15-12 yo No LMP recorded. Patient is postmenopausal. Approximately 7 years since onset of menopause.    Vaginal bleeding, spotting, or discharge? no Urinary incontinence? Yes, not during day, but more at night  Sexually active: yes Number of sexual partners: one Gender of sexual Partners: male Dyspareunia? none Last [id number] date 2021 and was normal  History of abnormal Pap: yes, HPV+ about 20 years ago STI history: no STI/HIV testing or immunizations needed? No.  Contraceptive methods: postmenopausal  Health Maintenance > Reviewed breast self-awareness > History of abnormal mammogram: No > Exercise: walking, moderately active > Dietary Supplements: Vit D, magnesium, biotin, red yeast > Body mass index is 24.62 kg/m.  > Recent dental visit Yes.   > Seat Belt Use: Yes.   > Texting and driving? No. > Concern for alcohol abuse? none  Tobacco or other drug use: denied. Tobacco Use: Low Risk  (09/20/2023)   Patient History    Smoking Tobacco Use: Never    Smokeless Tobacco Use: Never    Passive Exposure: Not on file     PHQ-2 Score: In last two weeks, how often have you felt: Little interest or pleasure in doing things: Not at all (0) Feeling down, depressed or hopeless: Not at all (0) Score: 0  GAD-2 Over the last 2 weeks, how often have you been bothered by the following problems? Feeling nervous, anxious or on edge: Several days (+1) Not being able to stop or control worrying: Not at all (0)} Score: 1   If >50:  Last mammogram:  03/2023, BIRADS I Age at menopause: ~50 Last colonoscopy: 2018, due in 2028 Last lipid screening: through  county Hep C Screening: unknown  _________________________________________________________  Current Outpatient Medications  Medication Sig Dispense Refill   ALPRAZolam (XANAX) 0.5 MG tablet Take 1 tablet (0.5 mg total) by mouth daily as needed for anxiety. 30 tablet 1   buPROPion (WELLBUTRIN XL) 300 MG 24 hr tablet TAKE 1 TABLET BY MOUTH EVERY DAY 90 tablet 3   estradiol (ESTRACE) 0.1 MG/GM vaginal cream Place 0.25 Applicatorfuls vaginally at bedtime. 90 g 6   losartan-hydrochlorothiazide (HYZAAR) 50-12.5 MG tablet TAKE 1 TABLET BY MOUTH EVERY DAY 90 tablet 3   sertraline (ZOLOFT) 50 MG tablet TAKE 1 TABLET BY MOUTH EVERY DAY 90 tablet 3   traZODone (DESYREL) 100 MG tablet TAKE 1 TABLET BY MOUTH AT BEDTIME AS NEEDED FOR SLEEP. 90 tablet 3   No current facility-administered medications for this visit.   No Known Allergies  Past Medical History:  Diagnosis Date   Anxiety    Carpal tunnel syndrome of right wrist    Hypercholesteremia    Hypertension    Past Surgical History:  Procedure Laterality Date   COLONOSCOPY WITH PROPOFOL N/A 04/27/2017   Procedure: COLONOSCOPY WITH PROPOFOL;  Surgeon: Midge Minium, MD;  Location[facility];  Service: Endoscopy;  Laterality: N/A;   no past surgery     TONSILLECTOMY     age 81   WRIST SURGERY  06/2019   carpal tunnel   OB History     Gravida  2   Para      Term      Preterm      AB      Living  2      SAB      IAB      Ectopic      Multiple      Live Births  2          Social History   Tobacco Use   Smoking status: Never   Smokeless tobacco: Never  Substance Use Topics   Alcohol use: Yes    Comment: occasionally   Social History   Substance and Sexual Activity  Sexual Activity Yes   Partners: Male   Birth control/protection: Post-menopausal    Immunization History  Administered Date(s) Administered   Influenza, Seasonal, Injecte, Preservative Fre 09/20/2023   Influenza,inj,Quad PF,6+ Mos  07/24/2019, 09/12/2022   Tdap 03/13/2018     Review Of Systems  Constitutional: Denied constitutional symptoms, night sweats, recent illness, fatigue, fever, insomnia and weight loss.  Eyes: Denied eye symptoms, eye pain, photophobia, vision change and visual disturbance.  Ears/Nose/Throat/Neck: Denied ear, nose, throat or neck symptoms, hearing loss, nasal discharge, sinus congestion and sore throat.  Cardiovascular: Denied cardiovascular symptoms,  arrhythmia, chest pain/pressure, edema, exercise intolerance, orthopnea and palpitations.  Respiratory: Denied pulmonary symptoms, asthma, pleuritic pain, productive sputum, cough, dyspnea and wheezing.  Gastrointestinal: Denied, gastro-esophageal reflux, melena, nausea and vomiting.  Genitourinary: Denied genitourinary symptoms including symptomatic vaginal discharge, pelvic relaxation issues, and urinary complaints.  Musculoskeletal: Denied musculoskeletal symptoms, stiffness, swelling, muscle weakness and myalgia.  Dermatologic: Denied dermatology symptoms, rash and scar.  Neurologic: Denied neurology symptoms, dizziness, headache, neck pain and syncope.  Psychiatric: Denied psychiatric symptoms, anxiety and depression.  Endocrine: Denied endocrine symptoms including hot flashes and night sweats.      Objective:    BP 136/78   Pulse 85   Ht 5\' 2"  (1.575 m)   Wt 134 lb 9.6 oz (61.1 kg)   BMI 24.62 kg/m   Constitutional: Well-developed, well-nourished female in no acute distress Neurological: Alert and oriented to person, place, and time Psychiatric: Mood and affect appropriate Skin: No rashes or lesions Neck: Supple without masses. Trachea is midline.Thyroid is normal size without masses Lymphatics: No cervical, axillary, supraclavicular, or inguinal adenopathy noted Respiratory: Clear to auscultation bilaterally. Good air movement with normal work of breathing. Cardiovascular: Regular rate and rhythm. Extremities grossly normal,  nontender with no edema; pulses regular Gastrointestinal: Soft, nontender, nondistended. No masses or hernias appreciated. No hepatosplenomegaly. No fluid wave. No rebound or guarding. Breast Exam: deferred with shared-decision making Genitourinary:  deferred    Rectal: deferred    Autumn Messing, CNM  09/20/23 2:50 PM

## 2023-09-20 NOTE — Assessment & Plan Note (Signed)
-   Reviewed changes in hormones that can impact vulvovaginal health.  - Rx for topical Estrace provided. Instructions for use reviewed.

## 2023-09-20 NOTE — Assessment & Plan Note (Addendum)
-   Reviewed health maintenance topics as documented below. - Most recent pap smear in 2021, repeat cervical cancer screening due 2026  per ASCCP guidelines. Will defer pap smear today with shared decision-making. - Regular lab work completed with county health screening.  - Mammogram UTD in 2024, BIRADS I. Order placed for screening in 2025. - STI screening declined.

## 2024-04-22 ENCOUNTER — Encounter

## 2024-04-23 ENCOUNTER — Ambulatory Visit
Admission: RE | Admit: 2024-04-23 | Discharge: 2024-04-23 | Disposition: A | Discharge status: Home / Self Care | Source: Ambulatory Visit

## 2024-04-23 DIAGNOSIS — Z1231 Encounter for screening mammogram for malignant neoplasm of breast: Secondary | ICD-10-CM | POA: Diagnosis not present

## 2024-04-23 DIAGNOSIS — Z01419 Encounter for gynecological examination (general) (routine) without abnormal findings: Secondary | ICD-10-CM | POA: Diagnosis present

## 2024-04-30 ENCOUNTER — Ambulatory Visit: Payer: Self-pay

## 2024-08-25 ENCOUNTER — Other Ambulatory Visit: Payer: Self-pay | Admitting: Otolaryngology

## 2024-08-25 DIAGNOSIS — D490 Neoplasm of unspecified behavior of digestive system: Secondary | ICD-10-CM

## 2024-08-28 ENCOUNTER — Ambulatory Visit
Admission: RE | Admit: 2024-08-28 | Discharge: 2024-08-28 | Disposition: A | Discharge status: Home / Self Care | Source: Ambulatory Visit | Attending: Otolaryngology | Admitting: Otolaryngology

## 2024-08-28 DIAGNOSIS — D490 Neoplasm of unspecified behavior of digestive system: Secondary | ICD-10-CM

## 2024-08-28 MED ORDER — IOPAMIDOL (ISOVUE-300) INJECTION 61%
75.0000 mL | Freq: Once | INTRAVENOUS | Status: AC | PRN
Start: 2024-08-28 — End: 2024-08-28
  Administered 2024-08-28: 75 mL via INTRAVENOUS

## 2024-09-15 ENCOUNTER — Other Ambulatory Visit: Payer: Self-pay | Admitting: Certified Nurse Midwife

## 2024-09-15 DIAGNOSIS — F411 Generalized anxiety disorder: Secondary | ICD-10-CM

## 2024-09-19 NOTE — Progress Notes (Signed)
 ANNUAL PREVENTATIVE CARE GYNECOLOGY  ENCOUNTER NOTE  Subjective:       Meghan Sandoval is a 58 y.o. G2P0 female here for a routine annual gynecologic exam. The patient is sexually active. The patient is taking hormone replacement therapy. Patient denies post-menopausal vaginal bleeding. The patient wears seatbelts: yes. The patient participates in regular exercise: no. Has the patient ever been transfused or tattooed?: no. The patient reports that there is not domestic violence in her life.  [name] had a recent diagnosis of throat cancer and will be having a glossectomy next week. She is coping well with the diagnosis and plan.   Current complaints: 1.  No current complaints.     Gynecologic History No LMP recorded. Patient is postmenopausal. Contraception: post menopausal status Last Pap: 07/29/2020. Results were: normal Last mammogram: 04/23/24 Results were: normal Last Colonoscopy:  04/27/2017 Last Dexa Scan: Not age appropriate   Obstetric History OB History  Gravida Para Term Preterm AB Living  2     2  SAB IAB Ectopic Multiple Live Births      2    # Outcome Date GA Lbr Len/2nd Weight Sex Type Anes PTL Lv  2 Gravida 1996    F Vag-Spont   LIV  1 Gravida 1992    F Vag-Spont   LIV    Past Medical History:  Diagnosis Date   Anxiety    Carpal tunnel syndrome of right wrist    Hypercholesteremia    Hypertension     Family History  Problem Relation Age of Onset   Other Mother        gastrointestinal   Hypertension Mother    Migraines Daughter    Migraines Daughter    Breast cancer Neg Hx     Past Surgical History:  Procedure Laterality Date   COLONOSCOPY WITH PROPOFOL  N/A 04/27/2017   Procedure: COLONOSCOPY WITH PROPOFOL ;  Surgeon: Jinny Carmine, MD;  Location: [facility];  Service: Endoscopy;  Laterality: N/A;   no past surgery     TONSILLECTOMY     age 14   WRIST SURGERY  06/2019   carpal tunnel    Social History   Socioeconomic History   Marital  status: Significant Other    Spouse name: Not on file   Number of children: 2   Years of education: Advice Worker   Highest education level: Not on file  Occupational History   Occupation: Scientist, Physiological: [employer]  Tobacco Use   Smoking status: Never   Smokeless tobacco: Never  Vaping Use   Vaping status: Never Used  Substance and Sexual Activity   Alcohol use: Yes    Comment: occasionally   Drug use: No   Sexual activity: Yes    Partners: Male    Birth control/protection: Post-menopausal  Other Topics Concern   Not on file  Social History Narrative   Patient lives at home with fiance.    Caffeine Use: 2-3 cups daily   Employed as a psychologist, sport and exercise    Some college    2 children (1 daughter, chief financial officer, works as a CLINICAL BIOCHEMIST at [employer])    Social Drivers of Corporate Investment Banker Strain: Low Risk  (08/26/2024)   Received from [facility]   Overall Financial Resource Strain (CARDIA)    How hard is it for you to pay for the very basics like food, housing, medical care, and heating?: Not very hard  Food Insecurity: No Food  Insecurity (09/03/2024)   Received from [facility]   Hunger Vital Sign    Within the past 12 months, you worried that your food would run out before you got the money to buy more.: Never true    Within the past 12 months, the food you bought just didn't last and you didn't have money to get more.: Never true  Transportation Needs: No Transportation Needs (09/03/2024)   Received from [facility] - Transportation    Lack of Transportation (Medical): No    Lack of Transportation (Non-Medical): No  Physical Activity: Not on file  Stress: Not on file  Social Connections: Not on file  Intimate Partner Violence: Not on file    Current Outpatient Medications on File Prior to Visit  Medication Sig Dispense Refill   ALPRAZolam  (XANAX ) 0.5 MG tablet Take 1 tablet (0.5 mg total) by mouth daily as needed for anxiety. 30  tablet 1   buPROPion  (WELLBUTRIN  XL) 300 MG 24 hr tablet TAKE 1 TABLET BY MOUTH EVERY DAY 90 tablet 3   estradiol  (ESTRACE ) 0.1 MG/GM vaginal cream Place 0.25 Applicatorfuls vaginally at bedtime. 90 g 6   losartan -hydrochlorothiazide (HYZAAR) 50-12.5 MG tablet TAKE 1 TABLET BY MOUTH EVERY DAY 90 tablet 3   sertraline  (ZOLOFT ) 50 MG tablet TAKE 1 TABLET BY MOUTH EVERY DAY 90 tablet 3   traZODone  (DESYREL ) 100 MG tablet TAKE 1 TABLET BY MOUTH AT BEDTIME AS NEEDED FOR SLEEP. 90 tablet 3   No current facility-administered medications on file prior to visit.    No Known Allergies    Review of Systems ROS Review of Systems - General ROS: negative for - chills, fatigue, fever, hot flashes, night sweats, weight gain or weight loss Psychological ROS: negative for - anxiety, decreased libido, depression, mood swings, physical abuse or sexual abuse Ophthalmic ROS: negative for - blurry vision, eye pain or loss of vision ENT ROS: negative for - headaches, hearing change, visual changes or vocal changes Allergy and Immunology ROS: negative for - hives, itchy/watery eyes or seasonal allergies Hematological and Lymphatic ROS: negative for - bleeding problems, bruising, swollen lymph nodes or weight loss Endocrine ROS: negative for - galactorrhea, hair pattern changes, hot flashes, malaise/lethargy, mood swings, palpitations, polydipsia/polyuria, skin changes, temperature intolerance or unexpected weight changes Breast ROS: negative for - new or changing breast lumps or nipple discharge Respiratory ROS: negative for - cough or shortness of breath Cardiovascular ROS: negative for - chest pain, irregular heartbeat, palpitations or shortness of breath Gastrointestinal ROS: no abdominal pain, change in bowel habits, or black or bloody stools Genito-Urinary ROS: no dysuria, trouble voiding, or hematuria Musculoskeletal ROS: negative for - joint pain or joint stiffness Neurological ROS: negative for - bowel  and bladder control changes Dermatological ROS: negative for rash and skin lesion changes   Objective:   BP 126/80   Pulse 85   Resp 16   Ht 5' 2 (1.575 m)   Wt 135 lb 1.6 oz (61.3 kg)   BMI 24.71 kg/m  CONSTITUTIONAL: Well-developed, well-nourished female in no acute distress.  PSYCHIATRIC: Normal mood and affect. Normal behavior. Normal judgment and thought content. NEUROLGIC: Alert and oriented to person, place, and time. Normal muscle tone coordination. No cranial nerve deficit noted. HENT:  Normocephalic, atraumatic, External right and left ear normal. Oropharynx is clear and moist EYES: Conjunctivae and EOM are normal. Pupils are equal, round, and reactive to light. No scleral icterus.  NECK: Normal range of motion, supple,  no masses.  Normal thyroid .  SKIN: Skin is warm and dry. No rash noted. Not diaphoretic. No erythema. No pallor. CARDIOVASCULAR: Normal heart rate noted, regular rhythm, no murmur. RESPIRATORY: Clear to auscultation bilaterally. Effort and breath sounds normal, no problems with respiration noted. BREASTS: Symmetric in size. No masses, skin changes, nipple drainage, or lymphadenopathy. ABDOMEN: Soft, normal bowel sounds, no distention noted.  No tenderness, rebound or guarding.  BLADDER: Normal PELVIC: deferred MUSCULOSKELETAL: Normal range of motion. No tenderness.  No cyanosis, clubbing, or edema.  2+ distal pulses. LYMPHATIC: No Axillary, Supraclavicular, or Inguinal Adenopathy.   Labs: Lab Results  Component Value Date   WBC 4.3 03/13/2018   HGB 13.9 03/13/2018   HCT 41.2 03/13/2018   MCV 91.4 03/13/2018   PLT 250.0 03/13/2018    Lab Results  Component Value Date   CREATININE 0.95 03/13/2018   BUN 17 03/13/2018   NA 135 03/13/2018   K 3.7 03/13/2018   CL 98 03/13/2018   CO2 30 03/13/2018    Lab Results  Component Value Date   ALT 51 (H) 03/13/2018   AST 33 03/13/2018   GGT 43 01/21/2016   ALKPHOS 95 03/13/2018   BILITOT 0.3  03/13/2018    Lab Results  Component Value Date   CHOL 284 (H) 07/19/2021   HDL 48 07/19/2021   LDLCALC 200 (H) 07/19/2021   TRIG 189 (H) 07/19/2021   CHOLHDL 5.9 (H) 07/19/2021    Lab Results  Component Value Date   TSH 2.62 02/05/2017    Lab Results  Component Value Date   HGBA1C 5.2 02/01/2017     Assessment:   1. Encounter for well woman exam with routine gynecological exam   2. Screening cholesterol level   3. Screening for diabetes mellitus (DM)   4. Thyroid  disorder screening      Plan:  Pap: UTD Mammogram: UTD Colon Screening:  UTD Labs: Pending  Declined, having surgery next week COVID Vaccination status: Return to Clinic - 1 Year   Damien Parsley, [facility]

## 2024-09-19 NOTE — Progress Notes (Signed)
 [facility] Outreach Completed with: Patient   Oncology Patient Navigator (OPN) received incoming call/message from patient.  Meghan Sandoval contacted OPN requesting a fax # or email address that she could forward her FMLA forms for completion. Fax number and directive that forms can also be uploaded in PDF format to MyChart provided by Clinical [facility] Glade Boxer and given to Meghan Sandoval.   Patient endorsed understanding of upcoming appointment times/location. Patient understands how to contact medical team/triage regarding symptoms/medical issues and OPN for any non-clinical needs.  Additional follow-up call will occur in the coming weeks.

## 2024-09-19 NOTE — Patient Instructions (Signed)
 Preventive Care 66-58 Years Old, Female Preventive care refers to lifestyle choices and visits with your health care provider that can promote health and wellness. Preventive care visits are also called wellness exams. What can I expect for my preventive care visit? Counseling Your health care provider may ask you questions about your: Medical history, including: Past medical problems. Family medical history. Pregnancy history. Current health, including: Menstrual cycle. Method of birth control. Emotional well-being. Home life and relationship well-being. Sexual activity and sexual health. Lifestyle, including: Alcohol, nicotine or tobacco, and drug use. Access to firearms. Diet, exercise, and sleep habits. Work and work Astronomer. Sunscreen use. Safety issues such as seatbelt and bike helmet use. Physical exam Your health care provider will check your: Height and weight. These may be used to calculate your BMI (body mass index). BMI is a measurement that tells if you are at a healthy weight. Waist circumference. This measures the distance around your waistline. This measurement also tells if you are at a healthy weight and may help predict your risk of certain diseases, such as type 2 diabetes and high blood pressure. Heart rate and blood pressure. Body temperature. Skin for abnormal spots. What immunizations do I need?  Vaccines are usually given at various ages, according to a schedule. Your health care provider will recommend vaccines for you based on your age, medical history, and lifestyle or other factors, such as travel or where you work. What tests do I need? Screening Your health care provider may recommend screening tests for certain conditions. This may include: Lipid and cholesterol levels. Diabetes screening. This is done by checking your blood sugar (glucose) after you have not eaten for a while (fasting). Pelvic exam and Pap test. Hepatitis B test. Hepatitis C  test. HIV (human immunodeficiency virus) test. STI (sexually transmitted infection) testing, if you are at risk. Lung cancer screening. Colorectal cancer screening. Mammogram. Talk with your health care provider about when you should start having regular mammograms. This may depend on whether you have a family history of breast cancer. BRCA-related cancer screening. This may be done if you have a family history of breast, ovarian, tubal, or peritoneal cancers. Bone density scan. This is done to screen for osteoporosis. Talk with your health care provider about your test results, treatment options, and if necessary, the need for more tests. Follow these instructions at home: Eating and drinking  Eat a diet that includes fresh fruits and vegetables, whole grains, lean protein, and low-fat dairy products. Take vitamin and mineral supplements as recommended by your health care provider. Do not drink alcohol if: Your health care provider tells you not to drink. You are pregnant, may be pregnant, or are planning to become pregnant. If you drink alcohol: Limit how much you have to 0-1 drink a day. Know how much alcohol is in your drink. In the [place] one drink equals one 12 oz bottle of beer (355 mL), one 5 oz glass of wine (148 mL), or one 1 oz glass of hard liquor (44 mL). Lifestyle Brush your teeth every morning and night with fluoride toothpaste. Floss one time each day. Exercise for at least 30 minutes 5 or more days each week. Do not use any products that contain nicotine or tobacco. These products include cigarettes, chewing tobacco, and vaping devices, such as e-cigarettes. If you need help quitting, ask your health care provider. Do not use drugs. If you are sexually active, practice safe sex. Use a condom or other form of protection to  prevent STIs. If you do not wish to become pregnant, use a form of birth control. If you plan to become pregnant, see your health care provider for a  prepregnancy visit. Take aspirin only as told by your health care provider. Make sure that you understand how much to take and what form to take. Work with your health care provider to find out whether it is safe and beneficial for you to take aspirin daily. Find healthy ways to manage stress, such as: Meditation, yoga, or listening to music. Journaling. Talking to a trusted person. Spending time with friends and family. Minimize exposure to UV radiation to reduce your risk of skin cancer. Safety Always wear your seat belt while driving or riding in a vehicle. Do not drive: If you have been drinking alcohol. Do not ride with someone who has been drinking. When you are tired or distracted. While texting. If you have been using any mind-altering substances or drugs. Wear a helmet and other protective equipment during sports activities. If you have firearms in your house, make sure you follow all gun safety procedures. Seek help if you have been physically or sexually abused. What's next? Visit your health care provider once a year for an annual wellness visit. Ask your health care provider how often you should have your eyes and teeth checked. Stay up to date on all vaccines. This information is not intended to replace advice given to you by your health care provider. Make sure you discuss any questions you have with your health care provider. Document Revised: 05/04/2021 Document Reviewed: 05/04/2021 Elsevier Patient Education  2024 [employer] How to Do a Breast Self-Exam Doing breast self-exams can help you stay healthy. They're one way to know what's normal for your breasts. They can help you catch a problem while it's still small and can be treated. You need to: Check your breasts often. Tell your doctor about any changes. You should do breast self-exams even if you have breast implants. What you need: A mirror. A well-lit room. A pillow or other soft object. How to do a  breast self-exam Look for changes  Take off all the clothes above your waist. Stand in front of a mirror in a room with good lighting. Put your hands down at your sides. Compare your breasts in the mirror. Look for difference between them, such as: Differences in shape. Differences in size. Wrinkles, dips, and bumps in one breast and not the other. Look at each breast for skin changes, such as: Redness. Scaly spots. Spots where your skin is thicker. Dimpling. Open sores. Look for changes in your nipples, such as: Fluid coming out of a nipple. Fluid around a nipple. Bleeding. Dimpling. Redness. A nipple that looks pushed in or that has changed position. Feel for changes Lie on your back. Feel each breast. To do this: Pick a breast to feel. Place a pillow under the shoulder closest to that breast. Put the arm closest to that breast behind your head. Feel the breast using the hand of your other arm. Use the pads of your three middle fingers to make small circles starting near the nipple. Use light, medium, and firm pressure. Keep making circles, moving down over the breast. Stop when you feel your ribs. Start making circles with your fingers again, this time going up until you reach your collarbone. Then, make circles out across your breast and into your armpit area. Squeeze your nipple. Check for fluid and lumps. Do these steps again  to check your other breast. Sit or stand in the tub or shower. With soapy water on your skin, feel each breast the same way you did when you were lying down. Write down what you find Writing down what you find can help you keep track of what you want to tell your doctor. Write down: What's normal for each breast. Any changes you find. Write down: The kind of change. If your breast feels tender or painful. Any lump you find. Write down its size and where it is. When you last had your period. General tips If you're breastfeeding, the best time  to check your breasts is after you feed your baby or after you use a breast pump. If you get a period, the best time to check your breasts is 5-7 days after your period ends. With time, you'll get more used to doing the self-exam. You'll also start to know if there are changes in your breasts. Contact a doctor if: You see a change in the shape or size of your breasts or nipples. You see a change in the skin of your breast or nipples. You have fluid coming from your nipples that isn't normal. You find a new lump or thick area. You have breast pain. You have any concerns about your breast health. This information is not intended to replace advice given to you by your health care provider. Make sure you discuss any questions you have with your health care provider. Document Revised: 01/16/2024 Document Reviewed: 01/16/2024 Elsevier Patient Education  2025 [employer].

## 2024-09-22 ENCOUNTER — Encounter: Payer: Self-pay | Admitting: Certified Nurse Midwife

## 2024-09-22 ENCOUNTER — Ambulatory Visit (INDEPENDENT_AMBULATORY_CARE_PROVIDER_SITE_OTHER): Admitting: Certified Nurse Midwife

## 2024-09-22 VITALS — BP 126/80 | HR 85 | Resp 16 | Ht 62.0 in | Wt 135.1 lb

## 2024-09-22 DIAGNOSIS — Z01419 Encounter for gynecological examination (general) (routine) without abnormal findings: Secondary | ICD-10-CM

## 2024-09-22 DIAGNOSIS — Z13220 Encounter for screening for lipoid disorders: Secondary | ICD-10-CM

## 2024-09-22 DIAGNOSIS — Z131 Encounter for screening for diabetes mellitus: Secondary | ICD-10-CM

## 2024-09-22 DIAGNOSIS — Z1329 Encounter for screening for other suspected endocrine disorder: Secondary | ICD-10-CM
